# Patient Record
Sex: Male | Born: 1970 | Race: Black or African American | Hispanic: No | Marital: Married | Smoking: Current some day smoker
Health system: Southern US, Community
[De-identification: ages and names within clinical notes are randomized; demographics above are authoritative.]

## PROBLEM LIST (undated history)

## (undated) DIAGNOSIS — I10 Essential (primary) hypertension: Secondary | ICD-10-CM

## (undated) DIAGNOSIS — G4730 Sleep apnea, unspecified: Secondary | ICD-10-CM

## (undated) HISTORY — PX: OTHER SURGICAL HISTORY: SHX169

---

## 2005-09-03 ENCOUNTER — Emergency Department (HOSPITAL_COMMUNITY): Admission: EM | Admit: 2005-09-03 | Discharge: 2005-09-03 | Payer: Self-pay | Admitting: Emergency Medicine

## 2020-08-25 ENCOUNTER — Other Ambulatory Visit: Payer: Self-pay | Admitting: Surgery

## 2020-08-25 DIAGNOSIS — C189 Malignant neoplasm of colon, unspecified: Secondary | ICD-10-CM

## 2020-09-04 ENCOUNTER — Other Ambulatory Visit: Payer: Self-pay

## 2020-09-04 ENCOUNTER — Ambulatory Visit
Admission: RE | Admit: 2020-09-04 | Discharge: 2020-09-04 | Disposition: A | Discharge status: Home / Self Care | Payer: Federal, State, Local not specified - PPO | Source: Ambulatory Visit | Attending: Surgery | Admitting: Surgery

## 2020-09-04 DIAGNOSIS — C189 Malignant neoplasm of colon, unspecified: Secondary | ICD-10-CM

## 2020-09-04 MED ORDER — IOPAMIDOL (ISOVUE-300) INJECTION 61%
75.0000 mL | Freq: Once | INTRAVENOUS | Status: AC | PRN
  Administered 2020-09-04: 75 mL via INTRAVENOUS

## 2020-09-14 NOTE — Progress Notes (Addendum)
DUE TO COVID-19 ONLY ONE VISITOR IS ALLOWED TO COME WITH YOU AND STAY IN THE WAITING ROOM ONLY DURING PRE OP AND PROCEDURE DAY OF SURGERY. THE 1 VISITOR  MAY VISIT WITH YOU AFTER SURGERY IN YOUR PRIVATE ROOM DURING VISITING HOURS ONLY!  YOU NEED TO HAVE A COVID 19 TEST ON__9/28/21 _____ @_1045am______ , THIS TEST MUST BE DONE BEFORE SURGERY,  COVID TESTING SITE [address] [place] [address], IT IS ON THE RIGHT GOING OUT [address] APPROXIMATELY  2 MINUTES PAST [employer] ON THE RIGHT. ONCE YOUR COVID TEST IS COMPLETED,  PLEASE BEGIN THE QUARANTINE INSTRUCTIONS AS OUTLINED IN YOUR HANDOUT.                Christian Perez  09/14/2020   Your procedure is scheduled on:  09/18/20    Report to [facility] Main  Entrance   Report to admitting at    1130 AM     Call this number if you have problems the morning of surgery [phone]           REMEMBER: FOLLOW ALL YOUR BOWEL PREP INSTRUCTIONS WITH CLEAR LIQUIDS FROM [place].   PROCEDURE 3   NOTHING BY MOUTH EXCEPT CLEAR LIQUIDS UNTIL THREE HOURS PRIOR TO SCHEDULED SURGERY.       CLEAR LIQUID DIET   Foods Allowed                                                                      Coffee and tea, regular and decaf                           Plain Jell-O any favor except red or purple                                         Fruit ices (not with fruit pulp)                                      Iced Popsicles                                     Carbonated beverages, regular and diet                                    Cranberry, grape and apple juices Sports drinks like Gatorade Lightly seasoned clear broth or consume(fat free) Sugar, honey syrup  _____________________________________________________________________      BRUSH YOUR TEETH MORNING OF SURGERY AND RINSE YOUR MOUTH OUT, NO CHEWING GUM CANDY OR MINTS.     Take these medicines the morning of surgery with A SIP OF WATER:  None  DO NOT TAKE ANY DIABETIC MEDICATIONS DAY OF YOUR SURGERY                               You may not have any metal on your body including hair pins and              piercings  Do not wear jewelry, make-up, lotions, powders or perfumes, deodorant             Do not wear nail polish on your fingernails.  Do not shave  48 hours prior to surgery.              Men may shave face and neck.   Do not bring valuables to the hospital. [place].  Contacts, dentures or bridgework may not be worn into surgery.  Leave suitcase in the car. After surgery it may be brought to your room.                 Please read over the following fact sheets you were given: _____________________________________________________________________  [facility] - Preparing for Surgery Before surgery, you can play an important role.  Because skin is not sterile, your skin needs to be as free of germs as possible.  You can reduce the number of germs on your skin by washing with CHG (chlorahexidine gluconate) soap before surgery.  CHG is an antiseptic cleaner which kills germs and bonds with the skin to continue killing germs even after washing. Please DO NOT use if you have an allergy to CHG or antibacterial soaps.  If your skin becomes reddened/irritated stop using the CHG and inform your nurse when you arrive at Short Stay. Do not shave (including legs and underarms) for at least 48 hours prior to the first CHG shower.  You may shave your face/neck. Please follow these instructions carefully:  1.  Shower with CHG Soap the night before surgery and the  morning of Surgery.  2.  If you choose to wash your hair, wash your hair first as usual with your  normal  shampoo.  3.  After you shampoo, rinse your hair and body thoroughly to remove the  shampoo.                           4.  Use CHG as you would any other liquid soap.  You can apply chg  directly  to the skin and wash                       Gently with a scrungie or clean washcloth.  5.  Apply the CHG Soap to your body ONLY FROM THE NECK DOWN.   Do not use on face/ open                           Wound or open sores. Avoid contact with eyes, ears mouth and genitals (private parts).                       Wash face,  Genitals (private parts) with your normal soap.             6.  Wash thoroughly, paying special attention to the area  where your surgery  will be performed.  7.  Thoroughly rinse your body with warm water from the neck down.  8.  DO NOT shower/wash with your normal soap after using and rinsing off  the CHG Soap.                9.  Pat yourself dry with a clean towel.            10.  Wear clean pajamas.            11.  Place clean sheets on your bed the night of your first shower and do not  sleep with pets. Day of Surgery : Do not apply any lotions/deodorants the morning of surgery.  Please wear clean clothes to the hospital/surgery center.  FAILURE TO FOLLOW THESE INSTRUCTIONS MAY RESULT IN THE CANCELLATION OF YOUR SURGERY PATIENT SIGNATURE_________________________________  NURSE SIGNATURE__________________________________  ________________________________________________________________________

## 2020-09-15 ENCOUNTER — Encounter (HOSPITAL_COMMUNITY): Payer: Self-pay

## 2020-09-15 ENCOUNTER — Encounter (HOSPITAL_COMMUNITY)
Admission: RE | Admit: 2020-09-15 | Discharge: 2020-09-15 | Disposition: A | Discharge status: Home / Self Care | Payer: Federal, State, Local not specified - PPO | Source: Ambulatory Visit | Attending: Surgery | Admitting: Surgery

## 2020-09-15 ENCOUNTER — Other Ambulatory Visit: Payer: Self-pay

## 2020-09-15 ENCOUNTER — Other Ambulatory Visit (HOSPITAL_COMMUNITY)
Admission: RE | Admit: 2020-09-15 | Discharge: 2020-09-15 | Disposition: A | Discharge status: Home / Self Care | Payer: Federal, State, Local not specified - PPO | Source: Ambulatory Visit | Attending: Surgery | Admitting: Surgery

## 2020-09-15 ENCOUNTER — Other Ambulatory Visit (HOSPITAL_COMMUNITY): Payer: Federal, State, Local not specified - PPO

## 2020-09-15 DIAGNOSIS — Z01812 Encounter for preprocedural laboratory examination: Secondary | ICD-10-CM | POA: Insufficient documentation

## 2020-09-15 DIAGNOSIS — U071 COVID-19: Secondary | ICD-10-CM | POA: Diagnosis not present

## 2020-09-15 HISTORY — DX: COVID-19: U07.1

## 2020-09-15 HISTORY — DX: Sleep apnea, unspecified: G47.30

## 2020-09-15 HISTORY — DX: Essential (primary) hypertension: I10

## 2020-09-15 LAB — BASIC METABOLIC PANEL
Anion gap: 11 (ref 5–15)
BUN: 13 mg/dL (ref 6–20)
CO2: 27 mmol/L (ref 22–32)
Calcium: 8.9 mg/dL (ref 8.9–10.3)
Chloride: 103 mmol/L (ref 98–111)
Creatinine, Ser: 1.37 mg/dL — ABNORMAL HIGH (ref 0.61–1.24)
GFR calc Af Amer: >60 mL/min (ref >60)
GFR calc non Af Amer: 59 mL/min — ABNORMAL LOW (ref >60)
Glucose, Bld: 103 mg/dL — ABNORMAL HIGH (ref 70–99)
Potassium: 3.7 mmol/L (ref 3.5–5.1)
Sodium: 141 mmol/L (ref 135–145)

## 2020-09-15 LAB — CBC
HCT: 45.6 % (ref 39.0–52.0)
Hemoglobin: 14.8 g/dL (ref 13.0–17.0)
MCH: 27.8 pg (ref 26.0–34.0)
MCHC: 32.5 g/dL (ref 30.0–36.0)
MCV: 85.6 fL (ref 80.0–100.0)
Platelets: 160 10*3/uL (ref 150–400)
RBC: 5.33 MIL/uL (ref 4.22–5.81)
RDW: 13.5 % (ref 11.5–15.5)
WBC: 4.3 10*3/uL (ref 4.0–10.5)
nRBC: 0 % (ref 0.0–0.2)

## 2020-09-15 LAB — SARS CORONAVIRUS 2 (TAT 6-24 HRS): SARS Coronavirus 2: POSITIVE — AB

## 2020-09-15 NOTE — Progress Notes (Signed)
Anesthesia Review:  PCP: DR Durene Fruits at [facility] on [address]  Cardiologist : Chest x-ray : EKG : requested EKG from [facility] on [address] They are to fax  Echo : Stress test: Cardiac Cath :  Activity level: yes  Sleep Study/ CPAP :yes  Fasting Blood Sugar :      / Checks Blood Sugar -- times a day:   Blood Thinner/ Instructions /Last Dose: ASA / Instructions/ Last Dose :  BMP done 09/15/20 faxed via epic to Dr Dema Severin.

## 2020-09-15 NOTE — Progress Notes (Signed)
Requested ekg from [place] at [facility] .

## 2020-09-16 ENCOUNTER — Telehealth: Payer: Self-pay | Admitting: Infectious Diseases

## 2020-09-16 ENCOUNTER — Ambulatory Visit: Payer: Self-pay | Admitting: Surgery

## 2020-09-16 NOTE — H&P (Signed)
CC: Referred by [facility] for newly diagnosed cecal neuroendocrine tumor  HPI: Mr. Christian Perez is a very pleasant 30yoM with hx of HTN, HLD who underwent his first screening colonoscopy at [facility] - 07/02/20 - was found to have a 3 cm mass in the cecum. This was biopsied and returned as a neuroendocrine tumor (carcinoid). He ultimately underwent a CT abdomen and pelvis for staging purposes at the end of August. This demonstrated a 2.5 cm mass in the cecum abutting the soft tissue medially. There is no evidence of metastatic disease noted. All we have is a copy of the report. We do not have physical images. Did not have a CT of the chest performed yet. He denies any symptoms related to this. He denies any history of rectal bleeding or abdominal pain. He states his colonoscopy was for screening purposes only.   PMH: HTN, HLD  PSH: Right wrist surgery; denies any abdominal/pelvic procedures  FHx: Only known family hx of malignancy is maternal grandmother may have had breast cancer  Social: Denies use of drugs; smokes 1 cigar per week; drinks EtOH once per week. Works in the [employer] as a Designer, industrial/product. Has 2 children - 49 yo son and 56 yo daughter  ROS: A comprehensive 10 system review of systems was completed with the patient and pertinent findings as noted above.  The patient is a 49 year old male.   Past Surgical History Mammie Lorenzo, LPN; 08/24/1883 1:66 AM) Colon Polyp Removal - Colonoscopy   Diagnostic Studies History Mammie Lorenzo, LPN; 0/05/3015 0:10 AM) Colonoscopy  within last year  Allergies Mammie Lorenzo, LPN; 08/21/2354 7:32 AM) No Known Drug Allergies  [08/25/2020]: Allergies Reconciled   Medication History Mammie Lorenzo, LPN; 2/0/2542 7:06 AM) amLODIPine-Olmesartan (10-40MG  Tablet, Oral) Active. Bystolic (5MG  Tablet, Oral) Active. hydroCHLOROthiazide (25MG  Tablet, Oral) Active. Pravastatin Sodium (40MG  Tablet,  Oral) Active. Vitamin D (Ergocalciferol) (1.25 MG(50000 UT) Capsule, Oral) Active. Medications Reconciled  Social History Mammie Lorenzo, LPN; 01/21/7627 3:15 AM) Alcohol use  Moderate alcohol use. Caffeine use  Carbonated beverages, Tea. No drug use  Tobacco use  Current some day smoker.  Family History Mammie Lorenzo, LPN; 12/25/6158 7:37 AM) Alcohol Abuse  Father. Cerebrovascular Accident  Mother. Depression  Brother. Diabetes Mellitus  Mother. Hypertension  Mother. Seizure disorder  Brother.  Other Problems Mammie Lorenzo, LPN; 1/0/6269 4:85 AM) Back Pain  High blood pressure  Sleep Apnea     Review of Systems [place] Gave M. Marquisa Salih MD; 08/25/2020 9:32 AM) General Not Present- Appetite Loss, Chills, Fatigue, Fever, Night Sweats, Weight Gain and Weight Loss. Skin Not Present- Change in Wart/Mole, Dryness, Hives, Jaundice, New Lesions, Non-Healing Wounds, Rash and Ulcer. HEENT Present- Wears glasses/contact lenses. Not Present- Earache, Hearing Loss, Hoarseness, Nose Bleed, Oral Ulcers, Ringing in the Ears, Seasonal Allergies, Sinus Pain, Sore Throat, Visual Disturbances and Yellow Eyes. Respiratory Not Present- Bloody sputum, Chronic Cough, Difficulty Breathing, Snoring and Wheezing. Breast Not Present- Breast Mass, Breast Pain, Nipple Discharge and Skin Changes. Cardiovascular Not Present- Chest Pain, Difficulty Breathing Lying Down, Leg Cramps, Palpitations, Rapid Heart Rate, Shortness of Breath and Swelling of Extremities. Gastrointestinal Not Present- Abdominal Pain, Bloating, Bloody Stool, Change in Bowel Habits, Chronic diarrhea, Constipation, Difficulty Swallowing, Excessive gas, Gets full quickly at meals, Hemorrhoids, Indigestion, Nausea, Rectal Pain and Vomiting. Male Genitourinary Not Present- Blood in Urine, Change in Urinary Stream, Frequency, Impotence, Nocturia, Painful Urination, Urgency and Urine Leakage. Neurological Not Present- Decreased Memory and  Difficulty Speaking. Psychiatric Not  Present- Anxiety and Depression. Hematology Not Present- Abnormal Bleeding, Blood Clots and Blood Thinners.  Vitals Claiborne Billings Dockery LPN; 01/23/9562 8:75 AM) 08/25/2020 9:02 AM Weight: 294.6 lb Height: 79in Body Surface Area: 2.69 m Body Mass Index: 33.19 kg/m  Temp.: 98.18F (Thermal Scan)  Pulse: 68 (Regular)  BP: 142/82(Sitting, Left Arm, Standard)       Physical Exam Harrell Gave M. Rilan Eiland MD; 08/25/2020 9:33 AM) The physical exam findings are as follows: Note: Constitutional: No acute distress; conversant; no deformities; wearing mask Eyes: Moist conjunctiva; no lid lag; anicteric sclerae; pupils equal and round Neck: Trachea midline; no palpable thyromegaly Lungs: Normal respiratory effort; no tactile fremitus CV: rrr; no palpable thrill; no pitting edema GI: Abdomen soft, nontender, nondistended; no palpable hepatosplenomegaly. No palpable masses MSK: Normal gait; no clubbing/cyanosis Psychiatric: Appropriate affect; alert and oriented 3 Lymphatic: No palpable cervical or axillary lymphadenopathy    Assessment & Plan [place]rell Gave M. Quintana Canelo MD; 08/25/2020 9:36 AM) COLON CANCER (C18.9)  Mr. Christian Perez is a very pleasant 32yoM with newly diagnosed cecal neuroendocrine tumor (carcinoid) - found on screening colonoscopy back 07/02/20.  CT AP through [place] center showed no evidence of metastasis intra-abdominally; we do not have CT chest yet though  -The anatomy and physiology of the GI tract was discussed at length with the patient. The pathophysiology of colon masses and NETs was discussed at length with associated pictures. -CT Chest showed no evidence of metastatic disease. We were able to review CT A/P - images sent on CD to our office.  -We discussed if no evidence of metastatic disease is confirmed on his workup, surgery to definitively address this lesion-laparoscopic and potential open techniques to a right  hemicolectomy. -The planned procedure, material risks (including, but not limited to, pain, bleeding, infection, scarring, need for blood transfusion, damage to surrounding structures- blood vessels/nerves/viscus/organs, damage to ureter, urine leak, leak from anastomosis, need for additional procedures, worsening of pre-existing medical conditions, need for stoma which may be permanent, hernia, recurrence, DVT/PE, pneumonia, heart attack, stroke, death) benefits and alternatives to surgery were discussed at length. The patient's questions were answered to his satisfaction, he voiced understanding and elected to proceed with surgery. Additionally, we discussed typical postoperative expectations and the recovery process - including time away from work expectations and recovery therein.  This patient encounter took 40 minutes today to perform the following: take history, perform exam, review outside records, interpret imaging, counsel the patient on their diagnosis and document encounter, findings & plan in the EHR  Signed by Ileana Roup, MD (08/25/2020 9:37 AM)

## 2020-09-16 NOTE — Telephone Encounter (Signed)
Called to Discuss with patient about Covid symptoms and the use of the monoclonal antibody infusion for those with mild to moderate Covid symptoms and at a high risk of hospitalization.     Pt appears to qualify for this infusion due to co-morbid conditions and/or a member of an at-risk group in accordance with the FDA Emergency Use Authorization.    Not feeling so great. Better but not great. Feels like symptoms started on Saturday afternoon. Now mostly symptoms he is experiencing is anosmia, altered taste, upper sinus symptoms.   Will schedule tomorrow for infusion with risk criteria HTN, BMI > 25.    Janene Madeira, MSN, NP-C [facility] [email or web address] Pager: [phone] Office: [phone] [place]: [phone]

## 2020-09-17 ENCOUNTER — Ambulatory Visit (HOSPITAL_COMMUNITY)
Admission: RE | Admit: 2020-09-17 | Discharge: 2020-09-17 | Disposition: A | Discharge status: Home / Self Care | Payer: Federal, State, Local not specified - PPO | Source: Ambulatory Visit | Attending: Pulmonary Disease | Admitting: Pulmonary Disease

## 2020-09-17 VITALS — BP 142/97 | HR 54 | Temp 98.6°F | Resp 16

## 2020-09-17 DIAGNOSIS — U071 COVID-19: Secondary | ICD-10-CM | POA: Diagnosis not present

## 2020-09-17 MED ORDER — ALBUTEROL SULFATE HFA 108 (90 BASE) MCG/ACT IN AERS: 2.0000 | INHALATION_SPRAY | Freq: Once | RESPIRATORY_TRACT | Status: DC | PRN

## 2020-09-17 MED ORDER — SODIUM CHLORIDE 0.9 % IV SOLN
1200.0000 mg | Freq: Once | INTRAVENOUS | Status: AC
  Administered 2020-09-17: 1200 mg via INTRAVENOUS

## 2020-09-17 MED ORDER — FAMOTIDINE IN NACL 20-0.9 MG/50ML-% IV SOLN: 20.0000 mg | Freq: Once | INTRAVENOUS | Status: DC | PRN

## 2020-09-17 MED ORDER — METHYLPREDNISOLONE SODIUM SUCC 125 MG IJ SOLR: 125.0000 mg | Freq: Once | INTRAMUSCULAR | Status: DC | PRN

## 2020-09-17 MED ORDER — EPINEPHRINE 0.3 MG/0.3ML IJ SOAJ: 0.3000 mg | Freq: Once | INTRAMUSCULAR | Status: DC | PRN

## 2020-09-17 MED ORDER — DIPHENHYDRAMINE HCL 50 MG/ML IJ SOLN: 50.0000 mg | Freq: Once | INTRAMUSCULAR | Status: DC | PRN

## 2020-09-17 MED ORDER — SODIUM CHLORIDE 0.9 % IV SOLN: INTRAVENOUS | Status: DC | PRN

## 2020-09-17 NOTE — Progress Notes (Signed)
  Diagnosis: COVID-19  Physician: Dr. Joya Gaskins  Procedure: Covid Infusion Clinic Med: casirivimab\imdevimab infusion - Provided patient with casirivimab\imdevimab fact sheet for patients, parents and caregivers prior to infusion.  Complications: No immediate complications noted.  Discharge: Discharged home   Rejeana Brock 09/17/2020

## 2020-09-17 NOTE — Discharge Instructions (Signed)

## 2020-09-17 NOTE — Progress Notes (Signed)
MD aware of positive COVID result

## 2020-09-18 LAB — TYPE AND SCREEN
ABO/RH(D): O POS
Antibody Screen: NEGATIVE

## 2020-10-15 ENCOUNTER — Encounter (HOSPITAL_COMMUNITY): Payer: Self-pay

## 2020-10-15 NOTE — Patient Instructions (Addendum)
DUE TO COVID-19 ONLY ONE VISITOR IS ALLOWED TO COME WITH YOU AND STAY IN THE WAITING ROOM ONLY DURING PRE OP AND PROCEDURE.   IF YOU WILL BE ADMITTED INTO THE HOSPITAL YOU ARE ALLOWED ONE SUPPORT PERSON DURING VISITATION HOURS ONLY (10AM -8PM)   . The support person may change daily. . The support person must pass our screening, gel in and out, and wear a mask at all times, including in the patient's room. . Patients must also wear a mask when staff or their support person are in the room.   Your procedure is scheduled on: Friday, 10-23-20   Report to [facility] Main  Entrance   Report to admitting at 9:00 AM AM   Call this number if you have problems the morning of surgery [phone]    Follow a clear liquid diet day of prep to prevent dehydration    Bowel Prep   Polyethylene Glycol Powder (Glycolax/Miralax) - Mix with 64 oz Gatorade/Powerade.  Drink gradually over the next few hours (8 oz glass every  15-30 minutes) until gone the day prior to surgery   Bisacodyl (Dulcolax) EC tablet 20 mg - Take with water the day prior to surgery   Neomycin (Mycifradin) tablet 1000 mg - Take at 2 pm, 3 pm and 10 pm after Miralax bowel prep the day prior to surgery.   Metronidazole (Flagyl) tablet 1000 mg - Take at 2 pm, 3 pm and 10 pm  after Miralax bowel prep the day prior to surgery.    Do not eat food :After Midnight.   May have liquids until 8:00 AM  day of surgery  CLEAR LIQUID DIET  Foods Allowed                                                                     Foods Excluded  Water, Black Coffee and tea, regular and decaf               liquids that you cannot  Plain Jell-O in any flavor  (No red)                                      see through such as: Fruit ices (not with fruit pulp)                                      milk, soups, orange juice              Iced Popsicles (No red)                                      All solid food                                    Apple juices Sports drinks like Gatorade (No red) Lightly seasoned clear broth or consume(fat free) Sugar, honey syrup  Sample Menu Breakfast  Lunch                                     Supper Cranberry juice                    Beef broth                            Chicken broth Jell-O                                     Grape juice                           Apple juice Coffee or tea                        Jell-O                                      Popsicle                                                Coffee or tea                        Coffee or tea       Drink 2 Ensure drinks the night before surgery.      Complete one Ensure drink the morning of surgery 3 hours prior to scheduled surgery at 8:00 AM.   Oral Hygiene is also important to reduce your risk of infection.                                    Remember - BRUSH YOUR TEETH THE MORNING OF SURGERY WITH YOUR REGULAR TOOTHPASTE   Do NOT smoke after Midnight   Take these medicines the morning of surgery with A SIP OF WATER:  Bystolic, Pravastatin                                You may not have any metal on your body including  jewelry, and body piercings             Do not wear  lotions, powders, perfumes/cologne, or deodorant             Men may shave face and neck.   Do not bring valuables to the hospital. [place].   Contacts, dentures or bridgework may not be worn into surgery.   Bring small overnight bag day of surgery.                 Please read over the following fact sheets you were given: IF YOU HAVE QUESTIONS ABOUT YOUR PRE OP INSTRUCTIONS PLEASE CALL [phone]   [facility] - Preparing for Surgery Before surgery, you can play an important role.  Because skin  is not sterile, your skin needs to be as free of germs as possible.  You can reduce the number of germs on your skin by washing with CHG (chlorahexidine gluconate) soap before  surgery.  CHG is an antiseptic cleaner which kills germs and bonds with the skin to continue killing germs even after washing. Please DO NOT use if you have an allergy to CHG or antibacterial soaps.  If your skin becomes reddened/irritated stop using the CHG and inform your nurse when you arrive at Short Stay. Do not shave (including legs and underarms) for at least 48 hours prior to the first CHG shower.  You may shave your face/neck.  Please follow these instructions carefully:  1.  Shower with CHG Soap the night before surgery and the  morning of surgery.  2.  If you choose to wash your hair, wash your hair first as usual with your normal  shampoo.  3.  After you shampoo, rinse your hair and body thoroughly to remove the shampoo.                             4.  Use CHG as you would any other liquid soap.  You can apply chg directly to the skin and wash.  Gently with a scrungie or clean washcloth.  5.  Apply the CHG Soap to your body ONLY FROM THE NECK DOWN.   Do   not use on face/ open                           Wound or open sores. Avoid contact with eyes, ears mouth and   genitals (private parts).                       Wash face,  Genitals (private parts) with your normal soap.             6.  Wash thoroughly, paying special attention to the area where your    surgery  will be performed.  7.  Thoroughly rinse your body with warm water from the neck down.  8.  DO NOT shower/wash with your normal soap after using and rinsing off the CHG Soap.                9.  Pat yourself dry with a clean towel.            10.  Wear clean pajamas.            11.  Place clean sheets on your bed the night of your first shower and do not  sleep with pets. Day of Surgery : Do not apply any lotions/deodorants the morning of surgery.  Please wear clean clothes to the hospital/surgery center.  FAILURE TO FOLLOW THESE INSTRUCTIONS MAY RESULT IN THE CANCELLATION OF YOUR SURGERY  PATIENT  SIGNATURE_________________________________  NURSE SIGNATURE__________________________________  ________________________________________________________________________   Christian Perez  An incentive spirometer is a tool that can help keep your lungs clear and active. This tool measures how well you are filling your lungs with each breath. Taking long deep breaths may help reverse or decrease the chance of developing breathing (pulmonary) problems (especially infection) following:  A long period of time when you are unable to move or be active. BEFORE THE PROCEDURE   If the spirometer includes an indicator to show your best effort, your nurse or respiratory therapist  will set it to a desired goal.  If possible, sit up straight or lean slightly forward. Try not to slouch.  Hold the incentive spirometer in an upright position. INSTRUCTIONS FOR USE  1. Sit on the edge of your bed if possible, or sit up as far as you can in bed or on a chair. 2. Hold the incentive spirometer in an upright position. 3. Breathe out normally. 4. Place the mouthpiece in your mouth and seal your lips tightly around it. 5. Breathe in slowly and as deeply as possible, raising the piston or the ball toward the top of the column. 6. Hold your breath for 3-5 seconds or for as long as possible. Allow the piston or ball to fall to the bottom of the column. 7. Remove the mouthpiece from your mouth and breathe out normally. 8. Rest for a few seconds and repeat Steps 1 through 7 at least 10 times every 1-2 hours when you are awake. Take your time and take a few normal breaths between deep breaths. 9. The spirometer may include an indicator to show your best effort. Use the indicator as a goal to work toward during each repetition. 10. After each set of 10 deep breaths, practice coughing to be sure your lungs are clear. If you have an incision (the cut made at the time of surgery), support your incision when coughing  by placing a pillow or rolled up towels firmly against it. Once you are able to get out of bed, walk around indoors and cough well. You may stop using the incentive spirometer when instructed by your caregiver.  RISKS AND COMPLICATIONS  Take your time so you do not get dizzy or light-headed.  If you are in pain, you may need to take or ask for pain medication before doing incentive spirometry. It is harder to take a deep breath if you are having pain. AFTER USE  Rest and breathe slowly and easily.  It can be helpful to keep track of a log of your progress. Your caregiver can provide you with a simple table to help with this. If you are using the spirometer at home, follow these instructions: [place] IF:   You are having difficultly using the spirometer.  You have trouble using the spirometer as often as instructed.  Your pain medication is not giving enough relief while using the spirometer.  You develop fever of 100.5 F (38.1 C) or higher. SEEK IMMEDIATE MEDICAL CARE IF:   You cough up bloody sputum that had not been present before.  You develop fever of 102 F (38.9 C) or greater.  You develop worsening pain at or near the incision site. MAKE SURE YOU:   Understand these instructions.  Will watch your condition.  Will get help right away if you are not doing well or get worse. Document Released: 04/17/2007 Document Revised: 02/27/2012 Document Reviewed: 06/18/2007 Exit[employer] Patient Information 2014 [employer], [place].   ________________________________________________________________________  WHAT IS A BLOOD TRANSFUSION? Blood Transfusion Information  A transfusion is the replacement of blood or some of its parts. Blood is made up of multiple cells which provide different functions.  Red blood cells carry oxygen and are used for blood loss replacement.  White blood cells fight against infection.  Platelets control bleeding.  Plasma helps clot  blood.  Other blood products are available for specialized needs, such as hemophilia or other clotting disorders. BEFORE THE TRANSFUSION  Who gives blood for transfusions?   Healthy volunteers who are  fully evaluated to make sure their blood is safe. This is blood bank blood. Transfusion therapy is the safest it has ever been in the practice of medicine. Before blood is taken from a donor, a complete history is taken to make sure that person has no history of diseases nor engages in risky social behavior (examples are intravenous drug use or sexual activity with multiple partners). The donor's travel history is screened to minimize risk of transmitting infections, such as malaria. The donated blood is tested for signs of infectious diseases, such as HIV and hepatitis. The blood is then tested to be sure it is compatible with you in order to minimize the chance of a transfusion reaction. If you or a relative donates blood, this is often done in anticipation of surgery and is not appropriate for emergency situations. It takes many days to process the donated blood. RISKS AND COMPLICATIONS Although transfusion therapy is very safe and saves many lives, the main dangers of transfusion include:   Getting an infectious disease.  Developing a transfusion reaction. This is an allergic reaction to something in the blood you were given. Every precaution is taken to prevent this. The decision to have a blood transfusion has been considered carefully by your caregiver before blood is given. Blood is not given unless the benefits outweigh the risks. AFTER THE TRANSFUSION  Right after receiving a blood transfusion, you will usually feel much better and more energetic. This is especially true if your red blood cells have gotten low (anemic). The transfusion raises the level of the red blood cells which carry oxygen, and this usually causes an energy increase.  The nurse administering the transfusion will monitor  you carefully for complications. HOME CARE INSTRUCTIONS  No special instructions are needed after a transfusion. You may find your energy is better. Speak with your caregiver about any limitations on activity for underlying diseases you may have. SEEK MEDICAL CARE IF:   Your condition is not improving after your transfusion.  You develop redness or irritation at the intravenous (IV) site. SEEK IMMEDIATE MEDICAL CARE IF:  Any of the following symptoms occur over the next 12 hours:  Shaking chills.  You have a temperature by mouth above 102 F (38.9 C), not controlled by medicine.  Chest, back, or muscle pain.  People around you feel you are not acting correctly or are confused.  Shortness of breath or difficulty breathing.  Dizziness and fainting.  You get a rash or develop hives.  You have a decrease in urine output.  Your urine turns a dark color or changes to pink, red, or brown. Any of the following symptoms occur over the next 10 days:  You have a temperature by mouth above 102 F (38.9 C), not controlled by medicine.  Shortness of breath.  Weakness after normal activity.  The white part of the eye turns yellow (jaundice).  You have a decrease in the amount of urine or are urinating less often.  Your urine turns a dark color or changes to pink, red, or brown. Document Released: 12/02/2000 Document Revised: 02/27/2012 Document Reviewed: 07/21/2008 [facility] Patient Information 2014 [place], [place].  _______________________________________________________________________

## 2020-10-15 NOTE — Progress Notes (Addendum)
COVID Vaccine Completed:  x2 Date COVID Vaccine completed:  12-25-19 2nd shot COVID vaccine manufacturer: [employer]   +Covid 09-15-20  PCP - Dr. Cathlean [place] Cardiologist -   Chest x-ray - CT chest 09-05-20 in Epic EKG - 10-16-20 in Epic Stress Test -  ECHO -  Cardiac Cath -  Pacemaker/ICD device last checked:  Sleep Study - 2011 + sleep apnea CPAP - Yes  Fasting Blood Sugar -  Checks Blood Sugar _____ times a day  Blood Thinner Instructions: Aspirin Instructions: Last Dose:  Anesthesia review:   Patient denies shortness of breath, fever, cough and chest pain at PAT appointment   Patient verbalized understanding of instructions that were given to them at the PAT appointment. Patient was also instructed that they will need to review over the PAT instructions again at home before surgery.

## 2020-10-16 ENCOUNTER — Other Ambulatory Visit: Payer: Self-pay

## 2020-10-16 ENCOUNTER — Encounter (HOSPITAL_COMMUNITY): Payer: Self-pay

## 2020-10-16 ENCOUNTER — Encounter (HOSPITAL_COMMUNITY)
Admission: RE | Admit: 2020-10-16 | Discharge: 2020-10-16 | Disposition: A | Discharge status: Home / Self Care | Payer: Federal, State, Local not specified - PPO | Source: Ambulatory Visit | Attending: Surgery | Admitting: Surgery

## 2020-10-16 DIAGNOSIS — I10 Essential (primary) hypertension: Secondary | ICD-10-CM | POA: Diagnosis not present

## 2020-10-16 DIAGNOSIS — Z01818 Encounter for other preprocedural examination: Secondary | ICD-10-CM | POA: Insufficient documentation

## 2020-10-16 LAB — CBC WITH DIFFERENTIAL/PLATELET
Abs Immature Granulocytes: 0 10*3/uL (ref 0.00–0.07)
Basophils Absolute: 0 10*3/uL (ref 0.0–0.1)
Basophils Relative: 1 %
Eosinophils Absolute: 0.1 10*3/uL (ref 0.0–0.5)
Eosinophils Relative: 2 %
HCT: 44.9 % (ref 39.0–52.0)
Hemoglobin: 14.7 g/dL (ref 13.0–17.0)
Immature Granulocytes: 0 %
Lymphocytes Relative: 35 %
Lymphs Abs: 1.3 10*3/uL (ref 0.7–4.0)
MCH: 27.5 pg (ref 26.0–34.0)
MCHC: 32.7 g/dL (ref 30.0–36.0)
MCV: 84.1 fL (ref 80.0–100.0)
Monocytes Absolute: 0.5 10*3/uL (ref 0.1–1.0)
Monocytes Relative: 13 %
Neutro Abs: 1.8 10*3/uL (ref 1.7–7.7)
Neutrophils Relative %: 49 %
Platelets: 169 10*3/uL (ref 150–400)
RBC: 5.34 MIL/uL (ref 4.22–5.81)
RDW: 13.3 % (ref 11.5–15.5)
WBC: 3.7 10*3/uL — ABNORMAL LOW (ref 4.0–10.5)
nRBC: 0 % (ref 0.0–0.2)

## 2020-10-16 LAB — COMPREHENSIVE METABOLIC PANEL
ALT: 33 U/L (ref 0–44)
AST: 26 U/L (ref 15–41)
Albumin: 4.2 g/dL (ref 3.5–5.0)
Alkaline Phosphatase: 52 U/L (ref 38–126)
Anion gap: 11 (ref 5–15)
BUN: 14 mg/dL (ref 6–20)
CO2: 26 mmol/L (ref 22–32)
Calcium: 9 mg/dL (ref 8.9–10.3)
Chloride: 102 mmol/L (ref 98–111)
Creatinine, Ser: 1.32 mg/dL — ABNORMAL HIGH (ref 0.61–1.24)
GFR, Estimated: >60 mL/min (ref >60)
Glucose, Bld: 98 mg/dL (ref 70–99)
Potassium: 3.6 mmol/L (ref 3.5–5.1)
Sodium: 139 mmol/L (ref 135–145)
Total Bilirubin: 1 mg/dL (ref 0.3–1.2)
Total Protein: 7.2 g/dL (ref 6.5–8.1)

## 2020-10-16 LAB — PROTIME-INR
INR: 1 (ref 0.8–1.2)
Prothrombin Time: 13 seconds (ref 11.4–15.2)

## 2020-10-16 LAB — APTT: aPTT: 30 seconds (ref 24–36)

## 2020-10-17 LAB — HEMOGLOBIN A1C
Hgb A1c MFr Bld: 6.3 % — ABNORMAL HIGH (ref 4.8–5.6)
Mean Plasma Glucose: 134 mg/dL

## 2020-10-20 ENCOUNTER — Inpatient Hospital Stay
Admission: RE | Admit: 2020-10-20 | Discharge: 2020-10-20 | Disposition: A | Discharge status: Home / Self Care | Payer: Self-pay | Source: Ambulatory Visit | Attending: Surgery | Admitting: Surgery

## 2020-10-20 ENCOUNTER — Other Ambulatory Visit (HOSPITAL_COMMUNITY): Payer: Self-pay | Admitting: Surgery

## 2020-10-20 DIAGNOSIS — C801 Malignant (primary) neoplasm, unspecified: Secondary | ICD-10-CM

## 2020-10-22 MED ORDER — BUPIVACAINE LIPOSOME 1.3 % IJ SUSP
20.0000 mL | Freq: Once | INTRAMUSCULAR | Status: DC
  Filled 2020-10-22: qty 20

## 2020-10-22 NOTE — Progress Notes (Signed)
Bowel prep instructions states to mix Miralex with 64 oz of Gatorade. Pt stated that he bought packages of Miralex, and wondering if the quantities of the packages would be sufficient. Pt also concerned that the instructions stated that he should drink 2 Ensure the night before, and 1 Ensure the morning of. However, he  wasn't given the ensure, because  his surgery date had changed. When he came in for his prior PST appt, and the nurse thought that it was given to him previously, and didn't give it to him at his 10-16-20 PST appt.. As a result, he bought some over the counter Ensure.  Patient advise that the instructions given were from Dr. Orest Dikes office. Therefore, he would need to contact Dr Orest Dikes office to verify if the quantity of the packages are okay. Also, advised that the Pre-surgery Ensure is hospital specific and cannot be purchased over the counter. Let  Dr. Jackson Latino office know that it wasn't provided to him, verses drinking the incorrect ensure.  Dr. Orest Dikes number provided to the patient. Pt agreeable and  verbalized understanding.

## 2020-10-23 ENCOUNTER — Inpatient Hospital Stay (HOSPITAL_COMMUNITY)
Admission: RE | Admit: 2020-10-23 | Discharge: 2020-10-26 | DRG: 331 | Disposition: A | Discharge status: Home / Self Care | Payer: Federal, State, Local not specified - PPO | Attending: Surgery | Admitting: Surgery

## 2020-10-23 ENCOUNTER — Encounter (HOSPITAL_COMMUNITY)
Admission: RE | Disposition: A | Discharge status: Home / Self Care | Payer: Self-pay | Source: Home / Self Care | Attending: Surgery

## 2020-10-23 ENCOUNTER — Inpatient Hospital Stay (HOSPITAL_COMMUNITY): Payer: Federal, State, Local not specified - PPO | Admitting: Certified Registered"

## 2020-10-23 ENCOUNTER — Encounter (HOSPITAL_COMMUNITY): Payer: Self-pay | Admitting: Surgery

## 2020-10-23 ENCOUNTER — Other Ambulatory Visit: Payer: Self-pay

## 2020-10-23 DIAGNOSIS — Z79899 Other long term (current) drug therapy: Secondary | ICD-10-CM

## 2020-10-23 DIAGNOSIS — I10 Essential (primary) hypertension: Secondary | ICD-10-CM | POA: Diagnosis present

## 2020-10-23 DIAGNOSIS — E785 Hyperlipidemia, unspecified: Secondary | ICD-10-CM | POA: Diagnosis present

## 2020-10-23 DIAGNOSIS — F17290 Nicotine dependence, other tobacco product, uncomplicated: Secondary | ICD-10-CM | POA: Diagnosis present

## 2020-10-23 DIAGNOSIS — Z23 Encounter for immunization: Secondary | ICD-10-CM | POA: Diagnosis not present

## 2020-10-23 DIAGNOSIS — Z8616 Personal history of COVID-19: Secondary | ICD-10-CM

## 2020-10-23 DIAGNOSIS — C180 Malignant neoplasm of cecum: Secondary | ICD-10-CM | POA: Diagnosis present

## 2020-10-23 DIAGNOSIS — C189 Malignant neoplasm of colon, unspecified: Secondary | ICD-10-CM | POA: Diagnosis present

## 2020-10-23 HISTORY — PX: LAPAROSCOPIC RIGHT HEMI COLECTOMY: SHX5926

## 2020-10-23 LAB — TYPE AND SCREEN
ABO/RH(D): O POS
Antibody Screen: NEGATIVE

## 2020-10-23 SURGERY — LAPAROSCOPIC RIGHT HEMI COLECTOMY
Anesthesia: General | Site: Abdomen | Laterality: Right

## 2020-10-23 MED ORDER — SODIUM CHLORIDE (PF) 0.9 % IJ SOLN
INTRAMUSCULAR | Status: AC
  Filled 2020-10-23: qty 20

## 2020-10-23 MED ORDER — BUPIVACAINE HCL 0.5 % IJ SOLN
INTRAMUSCULAR | Status: DC | PRN
  Administered 2020-10-23: 15 mL

## 2020-10-23 MED ORDER — ONDANSETRON HCL 4 MG PO TABS: 4.0000 mg | ORAL_TABLET | Freq: Four times a day (QID) | ORAL | Status: DC | PRN

## 2020-10-23 MED ORDER — ALVIMOPAN 12 MG PO CAPS
12.0000 mg | ORAL_CAPSULE | Freq: Two times a day (BID) | ORAL | Status: DC
  Filled 2020-10-23: qty 1

## 2020-10-23 MED ORDER — HYDROMORPHONE HCL 1 MG/ML IJ SOLN
0.5000 mg | INTRAMUSCULAR | Status: DC | PRN
  Administered 2020-10-23: 16:00:00 0.5 mg via INTRAVENOUS
  Filled 2020-10-23: qty 0.5

## 2020-10-23 MED ORDER — AMLODIPINE BESYLATE 10 MG PO TABS
10.0000 mg | ORAL_TABLET | Freq: Every day | ORAL | Status: DC
  Administered 2020-10-24 – 2020-10-26 (×3): 10 mg via ORAL
  Filled 2020-10-23 (×3): qty 1

## 2020-10-23 MED ORDER — SUGAMMADEX SODIUM 500 MG/5ML IV SOLN
INTRAVENOUS | Status: AC
  Filled 2020-10-23: qty 5

## 2020-10-23 MED ORDER — ALUM & MAG HYDROXIDE-SIMETH 200-200-20 MG/5ML PO SUSP: 30.0000 mL | Freq: Four times a day (QID) | ORAL | Status: DC | PRN

## 2020-10-23 MED ORDER — BISACODYL 5 MG PO TBEC: 20.0000 mg | DELAYED_RELEASE_TABLET | Freq: Once | ORAL | Status: DC

## 2020-10-23 MED ORDER — EPHEDRINE SULFATE-NACL 50-0.9 MG/10ML-% IV SOSY
PREFILLED_SYRINGE | INTRAVENOUS | Status: DC | PRN
  Administered 2020-10-23: 10 mg via INTRAVENOUS

## 2020-10-23 MED ORDER — ONDANSETRON HCL 4 MG/2ML IJ SOLN
INTRAMUSCULAR | Status: AC
  Filled 2020-10-23: qty 2

## 2020-10-23 MED ORDER — EPHEDRINE 5 MG/ML INJ
INTRAVENOUS | Status: AC
  Filled 2020-10-23: qty 10

## 2020-10-23 MED ORDER — TRAMADOL HCL 50 MG PO TABS
50.0000 mg | ORAL_TABLET | Freq: Four times a day (QID) | ORAL | 0 refills | Status: DC | PRN
Start: 2020-10-23 — End: 2020-10-23

## 2020-10-23 MED ORDER — ROCURONIUM BROMIDE 10 MG/ML (PF) SYRINGE
PREFILLED_SYRINGE | INTRAVENOUS | Status: AC
  Filled 2020-10-23: qty 10

## 2020-10-23 MED ORDER — POLYETHYLENE GLYCOL 3350 17 GM/SCOOP PO POWD: 1.0000 | Freq: Once | ORAL | Status: DC

## 2020-10-23 MED ORDER — SODIUM CHLORIDE (PF) 0.9 % IJ SOLN
INTRAMUSCULAR | Status: DC | PRN
  Administered 2020-10-23: 15 mL

## 2020-10-23 MED ORDER — FENTANYL CITRATE (PF) 100 MCG/2ML IJ SOLN
25.0000 ug | INTRAMUSCULAR | Status: DC | PRN
  Administered 2020-10-23: 50 ug via INTRAVENOUS

## 2020-10-23 MED ORDER — LIDOCAINE 20MG/ML (2%) 15 ML SYRINGE OPTIME
INTRAMUSCULAR | Status: DC | PRN
  Administered 2020-10-23: 1.5 mg/kg/h via INTRAVENOUS

## 2020-10-23 MED ORDER — HYDRALAZINE HCL 20 MG/ML IJ SOLN: 10.0000 mg | INTRAMUSCULAR | Status: DC | PRN

## 2020-10-23 MED ORDER — NEOMYCIN SULFATE 500 MG PO TABS: 1000.0000 mg | ORAL_TABLET | ORAL | Status: DC

## 2020-10-23 MED ORDER — FENTANYL CITRATE (PF) 250 MCG/5ML IJ SOLN
INTRAMUSCULAR | Status: AC
  Filled 2020-10-23: qty 5

## 2020-10-23 MED ORDER — LACTATED RINGERS IV SOLN: INTRAVENOUS | Status: DC

## 2020-10-23 MED ORDER — FENTANYL CITRATE (PF) 100 MCG/2ML IJ SOLN
INTRAMUSCULAR | Status: AC
  Administered 2020-10-23: 50 ug via INTRAVENOUS
  Filled 2020-10-23: qty 2

## 2020-10-23 MED ORDER — ROCURONIUM BROMIDE 10 MG/ML (PF) SYRINGE
PREFILLED_SYRINGE | INTRAVENOUS | Status: DC | PRN
  Administered 2020-10-23: 100 mg via INTRAVENOUS

## 2020-10-23 MED ORDER — DEXAMETHASONE SODIUM PHOSPHATE 10 MG/ML IJ SOLN
INTRAMUSCULAR | Status: AC
  Filled 2020-10-23: qty 1

## 2020-10-23 MED ORDER — ENSURE PRE-SURGERY PO LIQD
296.0000 mL | Freq: Once | ORAL | Status: DC
  Filled 2020-10-23: qty 296

## 2020-10-23 MED ORDER — AMISULPRIDE (ANTIEMETIC) 5 MG/2ML IV SOLN: 10.0000 mg | Freq: Once | INTRAVENOUS | Status: DC | PRN

## 2020-10-23 MED ORDER — HEPARIN SODIUM (PORCINE) 5000 UNIT/ML IJ SOLN: 5000.0000 [IU] | Freq: Once | INTRAMUSCULAR | Status: AC

## 2020-10-23 MED ORDER — ENSURE SURGERY PO LIQD
237.0000 mL | Freq: Two times a day (BID) | ORAL | Status: DC
  Administered 2020-10-24 – 2020-10-26 (×2): 237 mL via ORAL

## 2020-10-23 MED ORDER — PHENYLEPHRINE 40 MCG/ML (10ML) SYRINGE FOR IV PUSH (FOR BLOOD PRESSURE SUPPORT)
PREFILLED_SYRINGE | INTRAVENOUS | Status: AC
  Filled 2020-10-23: qty 10

## 2020-10-23 MED ORDER — METRONIDAZOLE 500 MG PO TABS: 1000.0000 mg | ORAL_TABLET | ORAL | Status: DC

## 2020-10-23 MED ORDER — CHLORHEXIDINE GLUCONATE CLOTH 2 % EX PADS: 6.0000 | MEDICATED_PAD | Freq: Once | CUTANEOUS | Status: DC

## 2020-10-23 MED ORDER — INFLUENZA VAC SPLIT QUAD 0.5 ML IM SUSY
0.5000 mL | PREFILLED_SYRINGE | INTRAMUSCULAR | Status: AC
  Administered 2020-10-26: 10:00:00 0.5 mL via INTRAMUSCULAR
  Filled 2020-10-23: qty 0.5

## 2020-10-23 MED ORDER — ACETAMINOPHEN 500 MG PO TABS
1000.0000 mg | ORAL_TABLET | ORAL | Status: AC
  Administered 2020-10-23: 1000 mg via ORAL
  Filled 2020-10-23: qty 2

## 2020-10-23 MED ORDER — IBUPROFEN 400 MG PO TABS
600.0000 mg | ORAL_TABLET | Freq: Four times a day (QID) | ORAL | Status: DC | PRN
  Administered 2020-10-23: 600 mg via ORAL
  Filled 2020-10-23: qty 1

## 2020-10-23 MED ORDER — DIPHENHYDRAMINE HCL 12.5 MG/5ML PO ELIX: 12.5000 mg | ORAL_SOLUTION | Freq: Four times a day (QID) | ORAL | Status: DC | PRN

## 2020-10-23 MED ORDER — DIPHENHYDRAMINE HCL 50 MG/ML IJ SOLN: 12.5000 mg | Freq: Four times a day (QID) | INTRAMUSCULAR | Status: DC | PRN

## 2020-10-23 MED ORDER — PRAVASTATIN SODIUM 20 MG PO TABS
40.0000 mg | ORAL_TABLET | Freq: Every day | ORAL | Status: DC
  Administered 2020-10-24 – 2020-10-26 (×3): 40 mg via ORAL
  Filled 2020-10-23 (×3): qty 2

## 2020-10-23 MED ORDER — AMLODIPINE-OLMESARTAN 10-40 MG PO TABS: 1.0000 | ORAL_TABLET | Freq: Every day | ORAL | Status: DC

## 2020-10-23 MED ORDER — HEPARIN SODIUM (PORCINE) 5000 UNIT/ML IJ SOLN
INTRAMUSCULAR | Status: AC
  Administered 2020-10-23: 5000 [IU] via SUBCUTANEOUS
  Filled 2020-10-23: qty 1

## 2020-10-23 MED ORDER — FENTANYL CITRATE (PF) 100 MCG/2ML IJ SOLN
INTRAMUSCULAR | Status: AC
  Filled 2020-10-23: qty 2

## 2020-10-23 MED ORDER — LIDOCAINE 2% (20 MG/ML) 5 ML SYRINGE
INTRAMUSCULAR | Status: DC | PRN
  Administered 2020-10-23: 80 mg via INTRAVENOUS

## 2020-10-23 MED ORDER — SODIUM CHLORIDE 0.9 % IV SOLN
2.0000 g | INTRAVENOUS | Status: AC
  Administered 2020-10-23: 2 g via INTRAVENOUS

## 2020-10-23 MED ORDER — LACTATED RINGERS IV SOLN: INTRAVENOUS | Status: DC | PRN

## 2020-10-23 MED ORDER — SIMETHICONE 80 MG PO CHEW: 40.0000 mg | CHEWABLE_TABLET | Freq: Four times a day (QID) | ORAL | Status: DC | PRN

## 2020-10-23 MED ORDER — BUPIVACAINE LIPOSOME 1.3 % IJ SUSP
INTRAMUSCULAR | Status: DC | PRN
  Administered 2020-10-23: 20 mL

## 2020-10-23 MED ORDER — SUGAMMADEX SODIUM 200 MG/2ML IV SOLN
INTRAVENOUS | Status: DC | PRN
  Administered 2020-10-23: 260 mg via INTRAVENOUS

## 2020-10-23 MED ORDER — IRBESARTAN 150 MG PO TABS
300.0000 mg | ORAL_TABLET | Freq: Every day | ORAL | Status: DC
  Administered 2020-10-24 – 2020-10-26 (×3): 300 mg via ORAL
  Filled 2020-10-23 (×3): qty 2

## 2020-10-23 MED ORDER — PROPOFOL 10 MG/ML IV BOLUS
INTRAVENOUS | Status: DC | PRN
  Administered 2020-10-23: 50 mg via INTRAVENOUS
  Administered 2020-10-23: 200 mg via INTRAVENOUS

## 2020-10-23 MED ORDER — OXYCODONE HCL 5 MG/5ML PO SOLN: 5.0000 mg | Freq: Once | ORAL | Status: DC | PRN

## 2020-10-23 MED ORDER — LIDOCAINE 2% (20 MG/ML) 5 ML SYRINGE
INTRAMUSCULAR | Status: AC
  Filled 2020-10-23: qty 5

## 2020-10-23 MED ORDER — MIDAZOLAM HCL 2 MG/2ML IJ SOLN
INTRAMUSCULAR | Status: AC
  Filled 2020-10-23: qty 2

## 2020-10-23 MED ORDER — ENSURE PRE-SURGERY PO LIQD
592.0000 mL | Freq: Once | ORAL | Status: DC
  Filled 2020-10-23: qty 592

## 2020-10-23 MED ORDER — 0.9 % SODIUM CHLORIDE (POUR BTL) OPTIME
TOPICAL | Status: DC | PRN
  Administered 2020-10-23: 2000 mL

## 2020-10-23 MED ORDER — DEXAMETHASONE SODIUM PHOSPHATE 10 MG/ML IJ SOLN
INTRAMUSCULAR | Status: DC | PRN
  Administered 2020-10-23: 10 mg via INTRAVENOUS

## 2020-10-23 MED ORDER — NEBIVOLOL HCL 5 MG PO TABS
5.0000 mg | ORAL_TABLET | Freq: Every day | ORAL | Status: DC
  Administered 2020-10-24 – 2020-10-25 (×2): 5 mg via ORAL
  Filled 2020-10-23 (×3): qty 1

## 2020-10-23 MED ORDER — HEPARIN SODIUM (PORCINE) 5000 UNIT/ML IJ SOLN
5000.0000 [IU] | Freq: Three times a day (TID) | INTRAMUSCULAR | Status: DC
  Administered 2020-10-23 – 2020-10-26 (×8): 5000 [IU] via SUBCUTANEOUS
  Filled 2020-10-23 (×8): qty 1

## 2020-10-23 MED ORDER — CHLORHEXIDINE GLUCONATE 0.12 % MT SOLN
15.0000 mL | Freq: Once | OROMUCOSAL | Status: AC
  Administered 2020-10-23: 15 mL via OROMUCOSAL

## 2020-10-23 MED ORDER — FENTANYL CITRATE (PF) 250 MCG/5ML IJ SOLN
INTRAMUSCULAR | Status: DC | PRN
  Administered 2020-10-23 (×3): 50 ug via INTRAVENOUS
  Administered 2020-10-23: 100 ug via INTRAVENOUS

## 2020-10-23 MED ORDER — LACTATED RINGERS IR SOLN
Status: DC | PRN
  Administered 2020-10-23: 1000 mL

## 2020-10-23 MED ORDER — PROPOFOL 10 MG/ML IV BOLUS
INTRAVENOUS | Status: AC
  Filled 2020-10-23: qty 20

## 2020-10-23 MED ORDER — ONDANSETRON HCL 4 MG/2ML IJ SOLN: 4.0000 mg | Freq: Once | INTRAMUSCULAR | Status: DC | PRN

## 2020-10-23 MED ORDER — SODIUM CHLORIDE 0.9 % IV SOLN
INTRAVENOUS | Status: AC
  Filled 2020-10-23: qty 2

## 2020-10-23 MED ORDER — LACTATED RINGERS IV SOLN
INTRAVENOUS | Status: DC
  Administered 2020-10-23: 1000 mL via INTRAVENOUS

## 2020-10-23 MED ORDER — BUPIVACAINE-EPINEPHRINE 0.5% -1:200000 IJ SOLN
INTRAMUSCULAR | Status: AC
  Filled 2020-10-23: qty 1

## 2020-10-23 MED ORDER — ALVIMOPAN 12 MG PO CAPS
12.0000 mg | ORAL_CAPSULE | ORAL | Status: AC
  Administered 2020-10-23: 12 mg via ORAL
  Filled 2020-10-23: qty 1

## 2020-10-23 MED ORDER — MIDAZOLAM HCL 2 MG/2ML IJ SOLN
INTRAMUSCULAR | Status: DC | PRN
  Administered 2020-10-23: 2 mg via INTRAVENOUS

## 2020-10-23 MED ORDER — ONDANSETRON HCL 4 MG/2ML IJ SOLN: 4.0000 mg | Freq: Four times a day (QID) | INTRAMUSCULAR | Status: DC | PRN

## 2020-10-23 MED ORDER — TRAMADOL HCL 50 MG PO TABS
50.0000 mg | ORAL_TABLET | Freq: Four times a day (QID) | ORAL | Status: DC | PRN
  Administered 2020-10-24 – 2020-10-26 (×6): 50 mg via ORAL
  Filled 2020-10-23 (×6): qty 1

## 2020-10-23 MED ORDER — ORAL CARE MOUTH RINSE: 15.0000 mL | Freq: Once | OROMUCOSAL | Status: AC

## 2020-10-23 MED ORDER — ONDANSETRON HCL 4 MG/2ML IJ SOLN
INTRAMUSCULAR | Status: DC | PRN
  Administered 2020-10-23: 4 mg via INTRAVENOUS

## 2020-10-23 MED ORDER — OXYCODONE HCL 5 MG PO TABS: 5.0000 mg | ORAL_TABLET | Freq: Once | ORAL | Status: DC | PRN

## 2020-10-23 MED ORDER — HYDROCHLOROTHIAZIDE 25 MG PO TABS
25.0000 mg | ORAL_TABLET | Freq: Every day | ORAL | Status: DC
  Administered 2020-10-24 – 2020-10-26 (×3): 25 mg via ORAL
  Filled 2020-10-23 (×3): qty 1

## 2020-10-23 MED ORDER — ACETAMINOPHEN 500 MG PO TABS
1000.0000 mg | ORAL_TABLET | Freq: Four times a day (QID) | ORAL | Status: DC
  Administered 2020-10-23 – 2020-10-26 (×11): 1000 mg via ORAL
  Filled 2020-10-23 (×11): qty 2

## 2020-10-23 MED FILL — traMADol HCL 50 MG TABS: 50 | 3 days supply | Qty: 15 | Fill #0

## 2020-10-23 SURGICAL SUPPLY — 57 items
APPLIER CLIP ROT 10 11.4 M/L (STAPLE)
BLADE CLIPPER SURG (BLADE) ×2 IMPLANT
CABLE HIGH FREQUENCY MONO STRZ (ELECTRODE) IMPLANT
CELLS DAT CNTRL 66122 CELL SVR (MISCELLANEOUS) IMPLANT
CHLORAPREP W/TINT 26 (MISCELLANEOUS) ×2 IMPLANT
CLIP APPLIE ROT 10 11.4 M/L (STAPLE) IMPLANT
COVER WAND RF STERILE (DRAPES) IMPLANT
DECANTER SPIKE VIAL GLASS SM (MISCELLANEOUS) ×2 IMPLANT
DISSECTOR BLUNT TIP ENDO 5MM (MISCELLANEOUS) IMPLANT
DRSG OPSITE POSTOP 4X6 (GAUZE/BANDAGES/DRESSINGS) ×2 IMPLANT
ELECT REM PT RETURN 15FT ADLT (MISCELLANEOUS) ×2 IMPLANT
GAUZE SPONGE 4X4 12PLY STRL (GAUZE/BANDAGES/DRESSINGS) ×2 IMPLANT
GLOVE BIO SURGEON STRL SZ7.5 (GLOVE) ×4 IMPLANT
GLOVE ECLIPSE 8.0 STRL XLNG CF (GLOVE) ×4 IMPLANT
GOWN STRL REUS W/TWL XL LVL3 (GOWN DISPOSABLE) ×8 IMPLANT
KIT TURNOVER KIT A (KITS) ×2 IMPLANT
LIGASURE IMPACT 36 18CM CVD LR (INSTRUMENTS) IMPLANT
NS IRRIG 1000ML POUR BTL (IV SOLUTION) ×4 IMPLANT
PACK COLON (CUSTOM PROCEDURE TRAY) ×2 IMPLANT
PAD POSITIONING PINK XL (MISCELLANEOUS) ×2 IMPLANT
PENCIL SMOKE EVACUATOR (MISCELLANEOUS) IMPLANT
PROTECTOR NERVE ULNAR (MISCELLANEOUS) IMPLANT
RELOAD PROXIMATE 75MM BLUE (ENDOMECHANICALS) ×2 IMPLANT
RTRCTR WOUND ALEXIS 18CM MED (MISCELLANEOUS)
SCISSORS LAP 5X35 DISP (ENDOMECHANICALS) ×2 IMPLANT
SEALER TISSUE G2 STRG ARTC 35C (ENDOMECHANICALS) ×2 IMPLANT
SET IRRIG TUBING LAPAROSCOPIC (IRRIGATION / IRRIGATOR) ×2 IMPLANT
SET TUBE SMOKE EVAC HIGH FLOW (TUBING) ×2 IMPLANT
SHEARS HARMONIC ACE PLUS 36CM (ENDOMECHANICALS) IMPLANT
SLEEVE ADV FIXATION 5X100MM (TROCAR) ×4 IMPLANT
SLEEVE ENDOPATH XCEL 5M (ENDOMECHANICALS) ×2 IMPLANT
STAPLER 90 3.5 STAND SLIM (STAPLE) ×2
STAPLER 90 3.5 STD SLIM (STAPLE) ×1 IMPLANT
STAPLER GUN LINEAR PROX 60 (STAPLE) IMPLANT
STAPLER PROXIMATE 75MM BLUE (STAPLE) ×2 IMPLANT
STAPLER VISISTAT 35W (STAPLE) ×2 IMPLANT
SUT MNCRL AB 4-0 PS2 18 (SUTURE) ×2 IMPLANT
SUT PDS AB 1 CT1 27 (SUTURE) ×4 IMPLANT
SUT PROLENE 2 0 CT2 30 (SUTURE) IMPLANT
SUT PROLENE 2 0 KS (SUTURE) IMPLANT
SUT SILK 2 0 (SUTURE) ×2
SUT SILK 2 0 SH CR/8 (SUTURE) ×2 IMPLANT
SUT SILK 2-0 18XBRD TIE 12 (SUTURE) ×1 IMPLANT
SUT SILK 3 0 (SUTURE) ×2
SUT SILK 3 0 SH CR/8 (SUTURE) ×2 IMPLANT
SUT SILK 3-0 18XBRD TIE 12 (SUTURE) ×1 IMPLANT
SYS LAPSCP GELPORT 120MM (MISCELLANEOUS)
SYSTEM LAPSCP GELPORT 120MM (MISCELLANEOUS) IMPLANT
TAPE CLOTH 4X10 WHT NS (GAUZE/BANDAGES/DRESSINGS) IMPLANT
TOWEL OR 17X26 10 PK STRL BLUE (TOWEL DISPOSABLE) ×2 IMPLANT
TRAY FOLEY MTR SLVR 14FR STAT (SET/KITS/TRAYS/PACK) ×2 IMPLANT
TRAY FOLEY MTR SLVR 16FR STAT (SET/KITS/TRAYS/PACK) IMPLANT
TROCAR ADV FIXATION 5X100MM (TROCAR) ×2 IMPLANT
TROCAR BALLN 12MMX100 BLUNT (TROCAR) ×2 IMPLANT
TROCAR XCEL NON-BLD 11X100MML (ENDOMECHANICALS) IMPLANT
TUBING CONNECTING 10 (TUBING) ×4 IMPLANT
YANKAUER SUCT BULB TIP NO VENT (SUCTIONS) ×2 IMPLANT

## 2020-10-23 NOTE — Op Note (Signed)
10/23/2020  1:41 PM  PATIENT:  Christian Perez  49 y.o. male  Patient Care Team: Shella Spearing, PA-C as PCP - General  PRE-OPERATIVE DIAGNOSIS:  Colon cancer, cecum  POST-OPERATIVE DIAGNOSIS:  Same  PROCEDURE:  Laparoscopic right hemicolectomy  SURGEON:  Sharon Mt. Dema Severin, MD  ASSISTANT: Michael Boston, MD  ANESTHESIA:   local and general  COUNTS:  Sponge, needle and instrument counts were reported correct x2 at the conclusion of the operation.  EBL: 50 mL  DRAINS: None  SPECIMEN: Right colon  COMPLICATIONS: None  FINDINGS: Mass palpable in cecum. Right hemicolectomy carried out. Ileal-transverse colon anastomosis. Specimen includes right branch of middle colic vessel.  DISPOSITION: PACU in satisfactory condition  DESCRIPTION: The patient was identified in preop holding and taken to the OR where he was placed on the operating room table. SCDs were placed. General endotracheal anesthesia was induced without difficulty. Hair on the abdomen was clipped. A foley catheter was placed under sterile conditions. Arms were then padded and tucked. Pressure points were padded. He was then prepped and draped in the usual sterile fashion. A surgical timeout was performed indicating the correct patient, procedure, positioning and need for preoperative antibiotics.   Beginning with the extraction site, the supraumbilical skin incision was created.  This was carried down to the level of fascia.  The fascia was incised the midline.  The peritoneum was carefully entered bluntly.  An [place] wound protector was placed.  Pneumoperitoneum was established after placing a cap on the wound protector.  Pressure 50 mmHg CO2 was obtained.  Laparoscope was inserted and demonstrated significant amounts of intra-abdominal adiposity.  There was an omental blanket coating the entire abdomen.  There was some fatty changes associate with the liver but no evident masses.  The peritoneal surface was smooth.  This  was retracted up above the liver.  Bilateral transversus abdominis plane blocks were carried out with a mixture of Exparel and Marcaine.  Two 5 mm ports were placed in the left hemiabdomen and 1 in the right hemiabdomen all under direct visualization.  The omentum was placed above the liver.  He was placed in Trendelenburg with some right side up.  The terminal ileum was identified and adherent to the right lower quadrant.  This was carefully mobilized off the retroperitoneum taking care to preserve the right gonadal's and ureter.  A lateral to medial approach was employed given his degree of adiposity.  His mesentery and omentum was quite friable.  The cecum and ascending colon were mobilized in a lateral to medial fashion by incising the Glendene Wyer line of [place].  As the hepatic flexure was approached, he was repositioned in reverse Trendelenburg.  The omentum was grasped and elevated anteriorly and the transverse colon retracted inferiorly.  The lesser sac was gained on the proximal third of the transverse colon.  The omentum was carefully dissected away from the transverse colon.  The hepatic flexure was approached from this approach and mobilized medially.  The duodenum was identified and protected free of injury.  He was repositioned in Trendelenburg.  The appendix was grasped and was able to be retracted in the left upper quadrant.  All flimsy attachments of the mesocolon to the duodenum were freed.  The ileocolic pedicle at this point was readily apparent.  The peritoneum overlying the mesentery at this location was incised.  The ileocolic pedicle was circumferentially dissected.  The location of the duodenum was again confirmed to be well away from our field of dissection.  The ileocolic pedicle was then divided using the vessel sealer.  The stump was inspected and noted to be hemostatic.  At this point there was more enough mobility to allow the colon to reach to the extraction site.  With the wound protector  in place, the colon was delivered onto the field.  The terminal ileum was identified.  A window was created in the mesentery at this location and the terminal ileum was divided using a 75 mm GIA stapler.  A Babcock clamp was placed onto the ileal staple line to maintain orientation during the remainder of the procedure.  There was a mass palpable in the cecum.  The right colon was eviscerated.  The middle colic pedicle was identified.  Including the right branch the middle colic, the right branch was ligated with the Enseal device.  The main middle colic pedicle remains in situ.  At this level, the transverse colon was divided using a 75 mm GIA stapler.  There was excellent perfusion at the staple line and 3-0 silk U stitches were used to assist with hemostasis at this location.  The intervening mesentery was divided using the Enseal device.  The specimen was passed off.  Attention was turned to creating the anastomosis.  Orientation was confirmed on the distal ileum such that there is no twisting.  An enterotomy and colotomy were created just beyond the respective staple lines.  A 75 mm blue load stapler was then used to create the anastomosis on the antimesenteric border of the small bowel and the antimesenteric tinea of the colon.  The anastomosis was inspected and hemostatic.  There was both a palpable pulse in the distal ileal mesentery as well as the colon this location.  The common enterotomy was then closed using a TA 90 blue load stapler.  The corners of the staple line were dunked with 3-0 silk suture.  The anastomosis was palpated and widely patent.  A 3-0 silk "crotch" suture was placed.  This was then placed back into the abdomen.  Pneumoperitoneum was reestablished and inspection demonstrated a hemostatic anastomosis/abdomen.  Omentum was brought down over the abdominal viscera.  The 5 mm ports were removed under direct visualization and the sites hemostatic.  We then switched over to the clean  portion of the procedure.  All equipment was passed off.  New gloves/gowns/equipment was then used.  Additional drapes were placed from the field.  The midline fascia of the extraction site was closed with 2 running #1 PDS sutures tied centrally.  All sponge, needle, and instrument counts were reported correct x2.  The skin of all incision sites was closed with 4-0 Monocryl subcuticular suture.  Dermabond was applied to the incisions.  He was then awakened from anesthesia, extubated, and transferred to a stretcher for transport to PACU in satisfactory condition.

## 2020-10-23 NOTE — Anesthesia Procedure Notes (Addendum)
Procedure Name: Intubation Date/Time: 10/23/2020 11:58 AM Performed by: Eben Burow, CRNA Pre-anesthesia Checklist: Patient identified, Emergency Drugs available, Suction available and Patient being monitored Patient Re-evaluated:Patient Re-evaluated prior to induction Oxygen Delivery Method: Circle system utilized Preoxygenation: Pre-oxygenation with 100% oxygen Induction Type: IV induction Ventilation: Mask ventilation without difficulty and Oral airway inserted - appropriate to patient size Laryngoscope Size: Miller and 3 Grade View: Grade I Tube type: Oral Tube size: 8.0 mm Number of attempts: 1 Airway Equipment and Method: Stylet and Oral airway Placement Confirmation: ETT inserted through vocal cords under direct vision,  positive ETCO2 and breath sounds checked- equal and bilateral Secured at: 26 cm Tube secured with: Tape Dental Injury: Teeth and Oropharynx as per pre-operative assessment  Comments: Placed by Hilda Blades

## 2020-10-23 NOTE — Anesthesia Preprocedure Evaluation (Addendum)
Anesthesia Evaluation  Patient identified by MRN, date of birth, ID band Patient awake    Reviewed: Allergy & Precautions, NPO status , Patient's Chart, lab work & pertinent test results  History of Anesthesia Complications Negative for: history of anesthetic complications  Airway Mallampati: III  TM Distance: >3 FB Neck ROM: Full    Dental  (+) Missing   Pulmonary sleep apnea and Continuous Positive Airway Pressure Ventilation , Current Smoker and Patient abstained from smoking.,    Pulmonary exam normal        Cardiovascular hypertension, Pt. on medications Normal cardiovascular exam     Neuro/Psych negative neurological ROS  negative psych ROS   GI/Hepatic Neg liver ROS, Cecal carcinoid tumor   Endo/Other  negative endocrine ROS  Renal/GU negative Renal ROS  negative genitourinary   Musculoskeletal negative musculoskeletal ROS (+)   Abdominal   Peds  Hematology negative hematology ROS (+)   Anesthesia Other Findings  Recent COVID infection (09/15/20)  Reproductive/Obstetrics                            Anesthesia Physical Anesthesia Plan  ASA: II  Anesthesia Plan: General   Post-op Pain Management:    Induction: Intravenous  PONV Risk Score and Plan: 2 and Ondansetron, Dexamethasone, Treatment may vary due to age or medical condition and Midazolam  Airway Management Planned: Oral ETT  Additional Equipment: None  Intra-op Plan:   Post-operative Plan: Extubation in OR  Informed Consent: I have reviewed the patients History and Physical, chart, labs and discussed the procedure including the risks, benefits and alternatives for the proposed anesthesia with the patient or authorized representative who has indicated his/her understanding and acceptance.     Dental advisory given  Plan Discussed with:   Anesthesia Plan Comments:        Anesthesia Quick Evaluation

## 2020-10-23 NOTE — Anesthesia Postprocedure Evaluation (Signed)
Anesthesia Post Note  Patient: Christian Perez  Procedure(s) Performed: LAPAROSCOPIC RIGHT HEMI COLECTOMY WITH TAP BLOCK (Right Abdomen)     Patient location during evaluation: PACU Anesthesia Type: General Level of consciousness: awake and alert Pain management: pain level controlled Vital Signs Assessment: post-procedure vital signs reviewed and stable Respiratory status: spontaneous breathing, nonlabored ventilation, respiratory function stable and patient connected to nasal cannula oxygen Cardiovascular status: blood pressure returned to baseline and stable Postop Assessment: no apparent nausea or vomiting Anesthetic complications: no   No complications documented.  Last Vitals:  Vitals:   10/23/20 1415 10/23/20 1430  BP: (!) 156/85 (!) 149/91  Pulse: 86 91  Resp: 11 11  Temp:  36.6 C  SpO2: 100% 100%    Last Pain:  Vitals:   10/23/20 1430  TempSrc:   PainSc: Asleep                 Mayanna Garlitz L Adalberto Metzgar

## 2020-10-23 NOTE — Transfer of Care (Signed)
Immediate Anesthesia Transfer of Care Note  Patient: Christian Perez  Procedure(s) Performed: LAPAROSCOPIC RIGHT HEMI COLECTOMY WITH TAP BLOCK (Right Abdomen)  Patient Location: PACU  Anesthesia Type:General  Level of Consciousness: awake, alert  and patient cooperative  Airway & Oxygen Therapy: Patient Spontanous Breathing and Patient connected to face mask oxygen  Post-op Assessment: Report given to RN and Post -op Vital signs reviewed and stable  Post vital signs: Reviewed and stable  Last Vitals:  Vitals Value Taken Time  BP    Temp    Pulse 108 10/23/20 1352  Resp 13 10/23/20 1352  SpO2 100 % 10/23/20 1352  Vitals shown include unvalidated device data.  Last Pain:  Vitals:   10/23/20 0942  TempSrc:   PainSc: 0-No pain         Complications: No complications documented.

## 2020-10-23 NOTE — H&P (Signed)
CC: Here today for surgery  HPI: Christian Perez is a very pleasant 49yoM with hx of HTN, HLD who underwent his first screening colonoscopy at [facility] - 07/02/20 - was found to have a 3 cm mass in the cecum. This was biopsied and returned as a neuroendocrine tumor (carcinoid). He ultimately underwent a CT abdomen and pelvis for staging purposes at the end of August. This demonstrated a 2.5 cm mass in the cecum abutting the soft tissue medially. There is no evidence of metastatic disease noted. All we have is a copy of the report. We do not have physical images. Did not have a CT of the chest performed yet. He denies any symptoms related to this. He denies any history of rectal bleeding or abdominal pain. He states his colonoscopy was for screening purposes only.  His surgery was initially scheduled but subsequently delayed as he unfortunately developed symptomatic COVID days before his surgery. He recovered uneventfully and has been doing well. He is ready for his surgery. Denies any complaints at this time. Feels well.   PMH: HTN, HLD  PSH: Right wrist surgery; denies any abdominal/pelvic procedures  FHx: Only known family hx of malignancy is maternal grandmother may have had breast cancer  Social: Denies use of drugs; smokes 1 cigar per week; drinks EtOH once per week. Works in the [employer] as a Designer, industrial/product. Has 2 children - 51 yo son and 47 yo daughter  ROS: A comprehensive 10 system review of systems was completed with the patient and pertinent findings as noted above.  Past Medical History:  Diagnosis Date  . COVID-19 09/15/2020  . Hypertension   . Sleep apnea    cpap- seting at 12     Past Surgical History:  Procedure Laterality Date  . right wrist surgery       History reviewed. No pertinent family history.  Social:  reports that he has been smoking cigars. He has never used smokeless tobacco. He reports current alcohol use. He  reports that he does not use drugs.  Allergies: No Known Allergies  Medications: I have reviewed the patient's current medications.  No results found for this or any previous visit (from the past 48 hour(s)).  No results found.  ROS - all of the below systems have been reviewed with the patient and positives are indicated with bold text General: chills, fever or night sweats Eyes: blurry vision or double vision ENT: epistaxis or sore throat Allergy/Immunology: itchy/watery eyes or nasal congestion Hematologic/Lymphatic: bleeding problems, blood clots or swollen lymph nodes Endocrine: temperature intolerance or unexpected weight changes Breast: new or changing breast lumps or nipple discharge Resp: cough, shortness of breath, or wheezing CV: chest pain or dyspnea on exertion GI: as per HPI GU: dysuria, trouble voiding, or hematuria MSK: joint pain or joint stiffness Neuro: TIA or stroke symptoms Derm: pruritus and skin lesion changes Psych: anxiety and depression  PE Blood pressure (!) 141/75, pulse 65, temperature 98.2 F (36.8 C), temperature source Oral, resp. rate 17, height 6\' 7"  (2.007 m), weight 131.7 kg, SpO2 100 %. Constitutional: NAD; conversant; no deformities Eyes: Moist conjunctiva; anicteric Lungs: Normal respiratory effort CV: RRR; no pitting edema GI: Abd soft, NT/ND; no palpable hepatosplenomegaly MSK: Normal range of motion of extremities Psychiatric: Appropriate affect; alert and oriented x3  No results found for this or any previous visit (from the past 48 hour(s)).  No results found.   A/P: Mr. Kise is a very pleasant 49yoM with  newly diagnosed cecal neuroendocrine tumor (carcinoid) - found on screening colonoscopy back 07/02/20.  CT AP through [place] [facility] [facility] showed no evidence of metastasis intra-abdominally; we do not have CT chest yet though  -The anatomy and physiology of the GI tract was discussed at length with the patient. The  pathophysiology of colon masses and NETs was discussed at length with associated pictures.  -CT Chest showed no evidence of metastatic disease. We were able to review CT A/P - images sent on CD to our office.   -We have reviewed laparoscopic and potential open techniques to a right hemicolectomy.  -The planned procedure, material risks (including, but not limited to, pain, bleeding, infection, scarring, need for blood transfusion, damage to surrounding structures- blood vessels/nerves/viscus/organs, damage to ureter, urine leak, leak from anastomosis, need for additional procedures, worsening of pre-existing medical conditions, need for stoma which may be permanent, hernia, recurrence, DVT/PE, pneumonia, heart attack, stroke, death) benefits and alternatives to surgery were discussed at length. The patient's questions were answered to his satisfaction, he voiced understanding and elected to proceed with surgery. Additionally, we discussed typical postoperative expectations and the recovery process - including time away from work expectations and recovery therein.  Christian Perez, M.D. [facility] Use [email or web address] to contact on call provider

## 2020-10-23 NOTE — Discharge Instructions (Signed)
POST OP INSTRUCTIONS AFTER COLON SURGERY  1. DIET: Be sure to include lots of fluids daily to stay hydrated - 64oz of water per day (8, 8 oz glasses).  Avoid fast food or heavy meals for the first couple of weeks as your are more likely to get nauseated. Avoid raw/uncooked fruits or vegetables for the first 4 weeks (its ok to have these if they are blended into smoothie form). If you have fruits/vegetables, make sure they are cooked until soft enough to mash on the roof of your mouth and chew your food well. Otherwise, diet as tolerated.  2. Take your usually prescribed home medications unless otherwise directed.  3. PAIN CONTROL: a. Pain is best controlled by a usual combination of three different methods TOGETHER: i. Ice/Heat ii. Over the counter pain medication iii. Prescription pain medication b. Most patients will experience some swelling and bruising around the surgical site.  Ice packs or heating pads (30-60 minutes up to 6 times a day) will help. Some people prefer to use ice alone, heat alone, alternating between ice & heat.  Experiment to what works for you.  Swelling and bruising can take several weeks to resolve.   c. It is helpful to take an over-the-counter pain medication regularly for the first few weeks: i. Ibuprofen (Motrin/Advil) - 200mg tabs - take 3 tabs (600mg) every 6 hours as needed for pain (unless you have been directed previously to avoid NSAIDs/ibuprofen) ii. Acetaminophen (Tylenol) - you may take 650mg every 6 hours as needed. You can take this with motrin as they act differently on the body. If you are taking a narcotic pain medication that has acetaminophen in it, do not take over the counter tylenol at the same time. iii. NOTE: You may take both of these medications together - most patients  find it most helpful when alternating between the two (i.e. Ibuprofen at 6am, tylenol at 9am, ibuprofen at 12pm ...) d. A  prescription for pain medication should be given to you  upon discharge.  Take your pain medication as prescribed if your pain is not adequatly controlled with the over-the-counter pain reliefs mentioned above.  4. Avoid getting constipated.  Between the surgery and the pain medications, it is common to experience some constipation.  Increasing fluid intake and taking a fiber supplement (such as Metamucil, Citrucel, FiberCon, MiraLax, etc) 1-2 times a day regularly will usually help prevent this problem from occurring.  A mild laxative (prune juice, Milk of Magnesia, MiraLax, etc) should be taken according to package directions if there are no bowel movements after 48 hours.    5. Dressing: Your incisions are covered in Dermabond which is like sterile superglue for the skin. This will come off on it's own in a couple weeks. It is waterproof and you may bathe normally starting the day after your surgery in a shower. Avoid baths/pools/lakes/oceans until your wounds have fully healed.  6. ACTIVITIES as tolerated:   a. Avoid heavy lifting (>10lbs or 1 gallon of milk) for the next 6 weeks. b. You may resume regular daily activities as tolerated--such as daily self-care, walking, climbing stairs--gradually increasing activities as tolerated.  If you can walk 30 minutes without difficulty, it is safe to try more intense activity such as jogging, treadmill, bicycling, low-impact aerobics.  c. DO NOT PUSH THROUGH PAIN.  Let pain be your guide: If it hurts to do something, don't do it. d. You may drive when you are no longer taking prescription pain medication, you   can comfortably wear a seatbelt, and you can safely maneuver your car and apply brakes.  7. FOLLOW UP in our office a. Please call [facility] at [phone] to set up an appointment to see your surgeon in the office for a follow-up appointment approximately 2 weeks after your surgery. b. Make sure that you call for this appointment the day you arrive home to insure a convenient appointment time.  9. If you  have disability or family leave forms that need to be completed, you may have them completed by your primary care physician's office; for return to work instructions, please ask our office staff and they will be happy to assist you in obtaining this documentation   When to call us [phone]: 1. Poor pain control 2. Reactions / problems with new medications (rash/itching, etc)  3. Fever over 101.5 F (38.5 C) 4. Inability to urinate 5. Nausea/vomiting 6. Worsening swelling or bruising 7. Continued bleeding from incision. 8. Increased pain, redness, or drainage from the incision  The clinic staff is available to answer your questions during regular business hours (8:30am-5pm).  Please don't hesitate to call and ask to speak to one of our nurses for clinical concerns.   A surgeon from [facility] is always on call at the hospitals   If you have a medical emergency, go to the nearest emergency room or call 911.  [facility] [address], [address], [place], [place]  [address] MAIN: [phone] FAX: [phone] [email or web address] 

## 2020-10-24 ENCOUNTER — Encounter (HOSPITAL_COMMUNITY): Payer: Self-pay | Admitting: Surgery

## 2020-10-24 LAB — CBC
HCT: 41.4 % (ref 39.0–52.0)
Hemoglobin: 13.4 g/dL (ref 13.0–17.0)
MCH: 27.2 pg (ref 26.0–34.0)
MCHC: 32.4 g/dL (ref 30.0–36.0)
MCV: 84 fL (ref 80.0–100.0)
Platelets: 174 10*3/uL (ref 150–400)
RBC: 4.93 MIL/uL (ref 4.22–5.81)
RDW: 13.4 % (ref 11.5–15.5)
WBC: 9 10*3/uL (ref 4.0–10.5)
nRBC: 0 % (ref 0.0–0.2)

## 2020-10-24 LAB — BASIC METABOLIC PANEL
Anion gap: 11 (ref 5–15)
BUN: 16 mg/dL (ref 6–20)
CO2: 22 mmol/L (ref 22–32)
Calcium: 8.6 mg/dL — ABNORMAL LOW (ref 8.9–10.3)
Chloride: 103 mmol/L (ref 98–111)
Creatinine, Ser: 1.22 mg/dL (ref 0.61–1.24)
GFR, Estimated: >60 mL/min (ref >60)
Glucose, Bld: 132 mg/dL — ABNORMAL HIGH (ref 70–99)
Potassium: 3.7 mmol/L (ref 3.5–5.1)
Sodium: 136 mmol/L (ref 135–145)

## 2020-10-24 NOTE — Progress Notes (Signed)
1 Day Post-Op   Subjective/Chief Complaint: Complains of soreness. No nausea and passing flatus   Objective: Vital signs in last 24 hours: Temp:  [97.6 F (36.4 C)-98.2 F (36.8 C)] 97.8 F (36.6 C) (11/06 0510) Pulse Rate:  [65-108] 71 (11/06 0510) Resp:  [11-18] 16 (11/06 0510) BP: (132-171)/(67-97) 141/79 (11/06 0510) SpO2:  [97 %-100 %] 98 % (11/06 0510) Weight:  [131.7 kg] 131.7 kg (11/05 0925) Last BM Date: 10/23/20  Intake/Output from previous day: 11/05 0701 - 11/06 0700 In: 2905 [P.O.:600; I.V.:2205; IV Piggyback:100] Out: 1450 [Urine:1400; Blood:50] Intake/Output this shift: No intake/output data recorded.  General appearance: alert and cooperative Resp: clear to auscultation bilaterally Cardio: regular rate and rhythm GI: soft, appropriately tender. incisions look good. good bs  Lab Results:  Recent Labs    10/24/20 0630  WBC 9.0  HGB 13.4  HCT 41.4  PLT 174   BMET Recent Labs    10/24/20 0630  NA 136  K 3.7  CL 103  CO2 22  GLUCOSE 132*  BUN 16  CREATININE 1.22  CALCIUM 8.6*   PT/INR No results for input(s): LABPROT, INR in the last 72 hours. ABG No results for input(s): PHART, HCO3 in the last 72 hours.  Invalid input(s): PCO2, PO2  Studies/Results: No results found.  Anti-infectives: Anti-infectives (From admission, onward)   Start     Dose/Rate Route Frequency Ordered Stop   10/23/20 1400  neomycin (MYCIFRADIN) tablet 1,000 mg  Status:  Discontinued       "And" Linked Group Details   1,000 mg Oral 3 times per day 10/23/20 0923 10/23/20 0931   10/23/20 1400  metroNIDAZOLE (FLAGYL) tablet 1,000 mg  Status:  Discontinued       "And" Linked Group Details   1,000 mg Oral 3 times per day 10/23/20 7867 10/23/20 0931   10/23/20 0932  sodium chloride 0.9 % with cefoTEtan (CEFOTAN) ADS Med       Note to Pharmacy: Kyra Leyland   : cabinet override      10/23/20 0932 10/23/20 1203   10/23/20 0930  cefoTEtan (CEFOTAN) 2 g in sodium  chloride 0.9 % 100 mL IVPB        2 g 200 mL/hr over 30 Minutes Intravenous On call to O.R. 10/23/20 0923 10/23/20 1155      Assessment/Plan: s/p Procedure(s): LAPAROSCOPIC RIGHT HEMI COLECTOMY WITH TAP BLOCK (Right) Advance diet  Ambulate POD 1  LOS: 1 day    Autumn Messing III 10/24/2020

## 2020-10-24 NOTE — Progress Notes (Signed)
Pharmacy Brief Note - Alvimopan (Entereg)  The standing order set for alvimopan (Entereg) now includes an automatic order to discontinue the drug after the patient has had a bowel movement. The change was approved by the [facility].   -Pt's RN noted/charted that entereg dose at [facility] on 11/6 was not given bc pt "had 2 bowl movements last night."  This patient has had bowel movements documented by nursing. Therefore, alvimopan has been discontinued. If there are questions, please contact the pharmacy at [phone].   Thank you-  Dia Sitter, PharmD, BCPS 10/24/2020 1:45 PM

## 2020-10-25 LAB — CBC
HCT: 40.8 % (ref 39.0–52.0)
Hemoglobin: 13.2 g/dL (ref 13.0–17.0)
MCH: 27.6 pg (ref 26.0–34.0)
MCHC: 32.4 g/dL (ref 30.0–36.0)
MCV: 85.2 fL (ref 80.0–100.0)
Platelets: 174 10*3/uL (ref 150–400)
RBC: 4.79 MIL/uL (ref 4.22–5.81)
RDW: 13.9 % (ref 11.5–15.5)
WBC: 6.1 10*3/uL (ref 4.0–10.5)
nRBC: 0 % (ref 0.0–0.2)

## 2020-10-25 LAB — BASIC METABOLIC PANEL
Anion gap: 8 (ref 5–15)
BUN: 15 mg/dL (ref 6–20)
CO2: 26 mmol/L (ref 22–32)
Calcium: 8.3 mg/dL — ABNORMAL LOW (ref 8.9–10.3)
Chloride: 106 mmol/L (ref 98–111)
Creatinine, Ser: 1.27 mg/dL — ABNORMAL HIGH (ref 0.61–1.24)
GFR, Estimated: >60 mL/min (ref >60)
Glucose, Bld: 99 mg/dL (ref 70–99)
Potassium: 3.6 mmol/L (ref 3.5–5.1)
Sodium: 140 mmol/L (ref 135–145)

## 2020-10-25 NOTE — Progress Notes (Signed)
2 Days Post-Op   Subjective/Chief Complaint: Feels like he had a bad night. Had a little more pain. Still passing flatus. No fever   Objective: Vital signs in last 24 hours: Temp:  [97.7 F (36.5 C)-98.6 F (37 C)] 98.1 F (36.7 C) (11/07 0530) Pulse Rate:  [57-76] 57 (11/07 0530) Resp:  [17-19] 19 (11/07 0530) BP: (126-140)/(72-93) 135/85 (11/07 0530) SpO2:  [98 %-100 %] 100 % (11/07 0530) Last BM Date: 10/24/20  Intake/Output from previous day: 11/06 0701 - 11/07 0700 In: 2298.2 [P.O.:1440; I.V.:858.2] Out: 3450 [Urine:3450] Intake/Output this shift: No intake/output data recorded.  General appearance: alert and cooperative Resp: clear to auscultation bilaterally Cardio: regular rate and rhythm GI: very soft, mild tenderness. incisions look good. good bs  Lab Results:  Recent Labs    10/24/20 0630 10/25/20 0610  WBC 9.0 6.1  HGB 13.4 13.2  HCT 41.4 40.8  PLT 174 174   BMET Recent Labs    10/24/20 0630 10/25/20 0610  NA 136 140  K 3.7 3.6  CL 103 106  CO2 22 26  GLUCOSE 132* 99  BUN 16 15  CREATININE 1.22 1.27*  CALCIUM 8.6* 8.3*   PT/INR No results for input(s): LABPROT, INR in the last 72 hours. ABG No results for input(s): PHART, HCO3 in the last 72 hours.  Invalid input(s): PCO2, PO2  Studies/Results: No results found.  Anti-infectives: Anti-infectives (From admission, onward)   Start     Dose/Rate Route Frequency Ordered Stop   10/23/20 1400  neomycin (MYCIFRADIN) tablet 1,000 mg  Status:  Discontinued       "And" Linked Group Details   1,000 mg Oral 3 times per day 10/23/20 0923 10/23/20 0931   10/23/20 1400  metroNIDAZOLE (FLAGYL) tablet 1,000 mg  Status:  Discontinued       "And" Linked Group Details   1,000 mg Oral 3 times per day 10/23/20 9767 10/23/20 0931   10/23/20 0932  sodium chloride 0.9 % with cefoTEtan (CEFOTAN) ADS Med       Note to Pharmacy: Kyra Leyland   : cabinet override      10/23/20 0932 10/23/20 1203    10/23/20 0930  cefoTEtan (CEFOTAN) 2 g in sodium chloride 0.9 % 100 mL IVPB        2 g 200 mL/hr over 30 Minutes Intravenous On call to O.R. 10/23/20 0923 10/23/20 1155      Assessment/Plan: s/p Procedure(s): LAPAROSCOPIC RIGHT HEMI COLECTOMY WITH TAP BLOCK (Right) Advance diet  Ambulate Will observe another day  LOS: 2 days    Autumn Messing III 10/25/2020

## 2020-10-26 LAB — CBC
HCT: 44.7 % (ref 39.0–52.0)
Hemoglobin: 14.2 g/dL (ref 13.0–17.0)
MCH: 26.8 pg (ref 26.0–34.0)
MCHC: 31.8 g/dL (ref 30.0–36.0)
MCV: 84.5 fL (ref 80.0–100.0)
Platelets: 184 10*3/uL (ref 150–400)
RBC: 5.29 MIL/uL (ref 4.22–5.81)
RDW: 13.7 % (ref 11.5–15.5)
WBC: 5.5 10*3/uL (ref 4.0–10.5)
nRBC: 0 % (ref 0.0–0.2)

## 2020-10-26 LAB — BASIC METABOLIC PANEL
Anion gap: 6 (ref 5–15)
BUN: 13 mg/dL (ref 6–20)
CO2: 29 mmol/L (ref 22–32)
Calcium: 8.8 mg/dL — ABNORMAL LOW (ref 8.9–10.3)
Chloride: 103 mmol/L (ref 98–111)
Creatinine, Ser: 1.29 mg/dL — ABNORMAL HIGH (ref 0.61–1.24)
GFR, Estimated: >60 mL/min (ref >60)
Glucose, Bld: 107 mg/dL — ABNORMAL HIGH (ref 70–99)
Potassium: 3.7 mmol/L (ref 3.5–5.1)
Sodium: 138 mmol/L (ref 135–145)

## 2020-10-26 NOTE — Progress Notes (Signed)
Subjective No acute events. Feeling well. Tolerating diet, moving bowels, ambulating, pain controlled.  Objective: Vital signs in last 24 hours: Temp:  [97.7 F (36.5 C)-98.7 F (37.1 C)] 97.7 F (36.5 C) (11/08 0531) Pulse Rate:  [58-71] 58 (11/08 0531) Resp:  [18-19] 18 (11/08 0531) BP: (126-150)/(80-93) 129/92 (11/08 0531) SpO2:  [96 %-99 %] 99 % (11/08 0531) Last BM Date: 10/25/20  Intake/Output from previous day: 11/07 0701 - 11/08 0700 In: 1040 [P.O.:1040] Out: 3350 [Urine:3350] Intake/Output this shift: No intake/output data recorded.  Gen: NAD, comfortable CV: RRR Pulm: Normal work of breathing Abd: Soft, not significantly tender nor distended Ext: SCDs in place  Lab Results: CBC  Recent Labs    10/25/20 0610 10/26/20 0508  WBC 6.1 5.5  HGB 13.2 14.2  HCT 40.8 44.7  PLT 174 184   BMET Recent Labs    10/25/20 0610 10/26/20 0508  NA 140 138  K 3.6 3.7  CL 106 103  CO2 26 29  GLUCOSE 99 107*  BUN 15 13  CREATININE 1.27* 1.29*  CALCIUM 8.3* 8.8*   PT/INR No results for input(s): LABPROT, INR in the last 72 hours. ABG No results for input(s): PHART, HCO3 in the last 72 hours.  Invalid input(s): PCO2, PO2  Studies/Results:  Anti-infectives: Anti-infectives (From admission, onward)   Start     Dose/Rate Route Frequency Ordered Stop   10/23/20 1400  neomycin (MYCIFRADIN) tablet 1,000 mg  Status:  Discontinued       "And" Linked Group Details   1,000 mg Oral 3 times per day 10/23/20 0923 10/23/20 0931   10/23/20 1400  metroNIDAZOLE (FLAGYL) tablet 1,000 mg  Status:  Discontinued       "And" Linked Group Details   1,000 mg Oral 3 times per day 10/23/20 0102 10/23/20 0931   10/23/20 0932  sodium chloride 0.9 % with cefoTEtan (CEFOTAN) ADS Med       Note to Pharmacy: Kyra Leyland   : cabinet override      10/23/20 0932 10/23/20 1203   10/23/20 0930  cefoTEtan (CEFOTAN) 2 g in sodium chloride 0.9 % 100 mL IVPB        2 g 200 mL/hr over 30  Minutes Intravenous On call to O.R. 10/23/20 0923 10/23/20 1155       Assessment/Plan: Patient Active Problem List   Diagnosis Date Noted  . Colon cancer ([place]) 10/23/2020   s/p Procedure(s): LAPAROSCOPIC RIGHT HEMI COLECTOMY WITH TAP BLOCK 10/23/2020  -Doing well; meeting all goals -We have reviewed expectations and recovery and answered his questions -We will plan discharge home today   LOS: 3 days   Nadeen Landau, MD [facility] Use [email or web address] to contact on call provider

## 2020-10-26 NOTE — Discharge Summary (Signed)
Patient ID: Christian Perez MRN: [id number] DOB/AGE: May 04, 1971 49 y.o.  Admit date: 10/23/2020 Discharge date: 10/26/2020  Discharge Diagnoses Patient Active Problem List   Diagnosis Date Noted  . Colon cancer ([place]) 10/23/2020    Procedures OR 10/23/20 laparoscopic right hemicolectomy  Hospital Course: He was admitted postoperatively and recovered appropriately. He began having bowel function, his diet advanced and he was mobilizing well on his own. He was comfortable with and stable for discharge home.    Allergies as of 10/26/2020   No Known Allergies     Medication List    TAKE these medications   amLODipine-olmesartan 10-40 MG tablet Commonly known as: AZOR Take 1 tablet by mouth daily.   Bystolic 5 MG tablet Generic drug: nebivolol Take 5 mg by mouth daily.   ergocalciferol 1.25 MG (50000 UT) capsule Commonly known as: VITAMIN D2 Take 50,000 Units by mouth once a week.   hydrochlorothiazide 25 MG tablet Commonly known as: HYDRODIURIL Take 25 mg by mouth daily.   pravastatin 40 MG tablet Commonly known as: PRAVACHOL Take 40 mg by mouth daily.   traMADol 50 MG tablet Commonly known as: Ultram Take 1 tablet (50 mg total) by mouth every 6 (six) hours as needed for up to 5 days (severe pain not controlled with tylenol and ibuprofen first).         Follow-up Information    Ileana Roup, MD Follow up in 2 week(s).   Specialty: General Surgery Contact information: [place] [id number] [phone]               Tatisha Cerino M. Dema Severin, M.D. [facility]

## 2020-10-27 LAB — SURGICAL PATHOLOGY

## 2020-11-04 ENCOUNTER — Other Ambulatory Visit: Payer: Self-pay

## 2020-11-24 NOTE — Progress Notes (Signed)
[place] NOTE  Patient Care Team: Shella Spearing, Hershal Coria as PCP - General  CHIEF COMPLAINTS/PURPOSE OF CONSULTATION:  Neuroendocrine tumor of the cecum  ASSESSMENT & PLAN:  No problem-specific Assessment & Plan notes found for this encounter.  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 Pt is completely asymptomatic. He had laparoscopic right hemicolectomy which showed well differentiated NET 2.5 cms, involvement of tumor in 6/16 LN, ki 67 3%, p T3N1. Please review below mentioned pathology. Given mesenteric mass identified on the surgery, I think its reasonable to monitor with abdominal imaging about every 6 months. We discussed about symptoms to watch as well. He has no current symptoms. PE today healthy male patient, no concerns. Given complete surgical resection, we recommend H and P, every 3 months, CT imaging approximately twice a yr or based on symptoms and physician's judgement, and labs. Advised to avoid eating fruits, eggplant, plantain and PPI before the labs for at least 48 hrs. RTC in early March after repeat imaging and labs. No FH of pancreatic, thyroid or pituitary tumors suggestive fo MEN syndrome. Colonoscopy yearly suggested by GI, please FU with all your other doctors.  Orders Placed This Encounter  Procedures  . CT Abdomen Pelvis W Wo Contrast    This exam should ONLY be ordered for initial diagnosis or follow up of known pancreatic/liver/renal/bladder masses.    Standing Status:   Future    Standing Expiration Date:   11/25/2021    Order Specific Question:   If indicated for the ordered procedure, I authorize the administration of contrast media per Radiology protocol    Answer:   Yes    Order Specific Question:   Preferred imaging location?    Answer:   [facility]    Order Specific Question:   Is Oral Contrast requested for this exam?    Answer:   Yes, Per Radiology protocol     HISTORY OF PRESENTING ILLNESS:  Christian Perez 49 y.o. male is here because of newly diagnosed neuroendocrine tumor of the cecum.  Mr Christian Perez is a 49 yr old who had screening colonoscopy which showed a 3.1 cm cecal mass. 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 He had staging CT abdomen and pelvis which showed a 2.5 cm mass in the cecum abutting the soft tissue medially. CT chest without any disease according to OSH records. He had laparoscopic right hemicolectomy which showed well differentiated NET 2.5 cms, involvement of tumor in 6/16 LN, ki 67 3%, p T3N1.  He is here for FU and surveillance. He is doing well, no concerns, he has some diarrhea since surgery, he describes this as more frequent bowel movements, like he has a urge to poop everytime he eats, stool is well formed otherwise. Rest of the ROS as mentioned below and negative  REVIEW OF SYSTEMS:   Constitutional: Denies fevers, chills or abnormal night sweats Eyes: Denies blurriness of vision, double vision or watery eyes Ears, nose, mouth, throat, and face: Denies mucositis or sore throat Respiratory: Denies cough, dyspnea or wheezes Cardiovascular: Denies palpitation, chest discomfort or lower extremity swelling Gastrointestinal:  Denies nausea, heartburn or change in bowel habits Skin: Denies abnormal skin rashes Lymphatics: Denies new lymphadenopathy or easy bruising Neurological:Denies numbness, tingling or new weaknesses Behavioral/Psych: Mood is stable, no new changes  All other systems  were reviewed with the patient and are negative.  MEDICAL HISTORY:  Past Medical History:  Diagnosis Date  . COVID-19 09/15/2020  . Hypertension   . Sleep apnea    cpap- seting at 12     SURGICAL HISTORY: Past Surgical History:  Procedure Laterality Date  . LAPAROSCOPIC RIGHT HEMI COLECTOMY Right  10/23/2020   Procedure: LAPAROSCOPIC RIGHT HEMI COLECTOMY WITH TAP BLOCK;  Surgeon: Ileana Roup, MD;  Location: [facility];  Service: General;  Laterality: Right;  . right wrist surgery       SOCIAL HISTORY: Social History   Socioeconomic History  . Marital status: Married    Spouse name: Not on file  . Number of children: 2  . Years of education: 55  . Highest education level: Not on file  Occupational History  . Not on file  Tobacco Use  . Smoking status: Current Some Day Smoker    Types: Cigars  . Smokeless tobacco: Never Used  . Tobacco comment: cigar once per week   Vaping Use  . Vaping Use: Never used  Substance and Sexual Activity  . Alcohol use: Yes    Comment: once per week   . Drug use: Never  . Sexual activity: Not on file  Other Topics Concern  . Not on file  Social History Narrative  . Not on file   Social Determinants of Health   Financial Resource Strain: Low Risk   . Difficulty of Paying Living Expenses: Not hard at all  Food Insecurity: No Food Insecurity  . Worried About Charity fundraiser in the Last Year: Never true  . Ran Out of Food in the Last Year: Never true  Transportation Needs: No Transportation Needs  . Lack of Transportation (Medical): No  . Lack of Transportation (Non-Medical): No  Physical Activity: Inactive  . Days of Exercise per Week: 0 days  . Minutes of Exercise per Session: 0 min  Stress: No Stress Concern Present  . Feeling of Stress : Not at all  Social Connections: Unknown  . Frequency of Communication with Friends and Family: More than three times a week  . Frequency of Social Gatherings with Friends and Family: Never  . Attends Religious Services: Not on file  . Active Member of Clubs or Organizations: No  . Attends Archivist Meetings: Never  . Marital Status: Never married  Intimate Partner Violence: Not At Risk  . Fear of Current or Ex-Partner: No  . Emotionally Abused: No  . Physically Abused: No   . Sexually Abused: No    FAMILY HISTORY: No family history on file.  ALLERGIES:  has No Known Allergies.  MEDICATIONS:  Current Outpatient Medications  Medication Sig Dispense Refill  . amLODipine-olmesartan (AZOR) 10-40 MG tablet Take 1 tablet by mouth daily.    Marland Kitchen BYSTOLIC 5 MG tablet Take 5 mg by mouth daily.    . ergocalciferol (VITAMIN D2) 1.25 MG (50000 UT) capsule Take 50,000 Units by mouth once a week.    . hydrochlorothiazide (HYDRODIURIL) 25 MG tablet Take 25 mg by mouth daily.    . pravastatin (PRAVACHOL) 40 MG tablet Take 40 mg by mouth daily.     No current facility-administered medications for this visit.     PHYSICAL EXAMINATION: ECOG PERFORMANCE STATUS: 0 - Asymptomatic  Vitals:   11/25/20 0815  BP: (!) 146/86  Pulse: (!) 50  Resp: 18  Temp: (!) 97 F (36.1 C)  SpO2: 99%   Filed Weights  11/25/20 0815  Weight: 285 lb 4.4 oz (129.4 kg)    GENERAL:alert, no distress and comfortable SKIN: skin color, texture, turgor are normal, no rashes or significant lesions EYES: normal, conjunctiva are pink and non-injected, sclera clear OROPHARYNX:no exudate, no erythema and lips, buccal mucosa, and tongue normal  NECK: supple, thyroid normal size, non-tender, without nodularity LYMPH:  no palpable lymphadenopathy in the cervical, axillary or inguinal LUNGS: clear to auscultation and percussion with normal breathing effort HEART: regular rate & rhythm and no murmurs and no lower extremity edema ABDOMEN:abdomen soft, non-tender and normal bowel sounds. Laparoscopic scars are well healed. Musculoskeletal:no cyanosis of digits and no clubbing  PSYCH: alert & oriented x 3 with fluent speech NEURO: no focal motor/sensory deficits  LABORATORY DATA:  I have reviewed the data as listed Lab Results  Component Value Date   WBC 5.5 10/26/2020   HGB 14.2 10/26/2020   HCT 44.7 10/26/2020   MCV 84.5 10/26/2020   PLT 184 10/26/2020     Chemistry      Component Value  Date/Time   NA 138 10/26/2020 0508   K 3.7 10/26/2020 0508   CL 103 10/26/2020 0508   CO2 29 10/26/2020 0508   BUN 13 10/26/2020 0508   CREATININE 1.29 (H) 10/26/2020 0508      Component Value Date/Time   CALCIUM 8.8 (L) 10/26/2020 0508   ALKPHOS 52 10/16/2020 1406   AST 26 10/16/2020 1406   ALT 33 10/16/2020 1406   BILITOT 1.0 10/16/2020 1406      RADIOGRAPHIC STUDIES: Reviewed imaging reports.  CT chest, no concerns for metastatic disease. I dont have a CT abdomen to review, just got the summary of the findings from OSH reports.  Pathology  FINAL MICROSCOPIC DIAGNOSIS:   A. COLON, RIGHT, RESECTION:  - Well-differentiated neuroendocrine tumor, 2.5 cm.  - Tumor focally extends into peri-ileal connective tissue.  - Metastatic neuroendocrine tumor in six of sixteen lymph nodes (6/16).  - Extramural tumor nodule, 2 cm.  - Margins not involved.  - See oncology table.   ONCOLOGY TABLE:  ILEUM NEUROENDOCRINE TUMOR  Procedure: Right hemicolectomy.  Tumor Site: Distal terminal ileum at ileocecal valve.  Tumor Size: 2.5 x 2 x 1 cm.  Tumor Focality: Unifocal.  Histologic Type and Grade: Neuroendocrine tumor, grade 1.    Mitotic Rate: Less than 2 per 10 high-power fields.    Ki-67 Labeling Index: Less than 3%.  Tumor Extension: Into peri-ileal connective tissue.  Margins: Not involved by tumor.  Lymphovascular Invasion: Present.  Large Mesenteric Masses (>2 cm): One mesenteric mass, 2 cm.  Regional Lymph Nodes:    Number of Lymph Nodes Involved: 6    Number of Lymph Nodes Examined: 16  Pathologic Stage Classification (pTNM, AJCC 8th Edition): pT3, pN1  Representative Tumor Block: A3, A4, A5 and A6  Comment(s): Immunohistochemistry for Ki-67 shows a proliferation rate of  less than 3%.  (v1.0.0.2)   Time I spent total of 45 minutes in the care of this patient including H and P, review of records, counseling and coordination of care.    Benay Pike,  MD 11/25/2020 8:47 AM

## 2020-11-25 ENCOUNTER — Inpatient Hospital Stay (HOSPITAL_COMMUNITY)
Payer: Federal, State, Local not specified - PPO | Attending: Hematology and Oncology | Admitting: Hematology and Oncology

## 2020-11-25 ENCOUNTER — Encounter (HOSPITAL_COMMUNITY): Payer: Self-pay | Admitting: Hematology and Oncology

## 2020-11-25 ENCOUNTER — Other Ambulatory Visit: Payer: Self-pay

## 2020-11-25 VITALS — BP 146/86 | HR 50 | Temp 97.0°F | Resp 18 | Ht 78.0 in | Wt 285.3 lb

## 2020-11-25 DIAGNOSIS — F17210 Nicotine dependence, cigarettes, uncomplicated: Secondary | ICD-10-CM | POA: Diagnosis not present

## 2020-11-25 DIAGNOSIS — E785 Hyperlipidemia, unspecified: Secondary | ICD-10-CM | POA: Insufficient documentation

## 2020-11-25 DIAGNOSIS — Z79899 Other long term (current) drug therapy: Secondary | ICD-10-CM | POA: Insufficient documentation

## 2020-11-25 DIAGNOSIS — C7A8 Other malignant neuroendocrine tumors: Secondary | ICD-10-CM

## 2020-11-25 DIAGNOSIS — G4730 Sleep apnea, unspecified: Secondary | ICD-10-CM | POA: Diagnosis not present

## 2020-11-25 DIAGNOSIS — I10 Essential (primary) hypertension: Secondary | ICD-10-CM | POA: Insufficient documentation

## 2020-11-25 NOTE — Patient Instructions (Signed)
[place] at [facility] Discharge Instructions   You were seen today by Dr. Chryl Heck.  She doesn't want to do anything today.  We will do a repeat scan in 3 months with labs and then follow up here.    Thank you for choosing [place] at [facility] to provide your oncology and hematology care.  To afford each patient quality time with our provider, please arrive at least 15 minutes before your scheduled appointment time.   If you have a lab appointment with the [place] please come in thru the Main Entrance and check in at the main information desk.  You need to re-schedule your appointment should you arrive 10 or more minutes late.  We strive to give you quality time with our providers, and arriving late affects you and other patients whose appointments are after yours.  Also, if you no show three or more times for appointments you may be dismissed from the clinic at the providers discretion.     Again, thank you for choosing [facility].  Our hope is that these requests will decrease the amount of time that you wait before being seen by our physicians.       _____________________________________________________________  Should you have questions after your visit to [facility], please contact our office at [phone] and follow the prompts.  Our office hours are 8:00 a.m. and 4:30 p.m. Monday - Friday.  Please note that voicemails left after 4:00 p.m. may not be returned until the following business day.  We are closed weekends and major holidays.  You do have access to a nurse 24-7, just call the main number to the clinic [phone] and do not press any options, hold on the line and a nurse will answer the phone.    For prescription refill requests, have your pharmacy contact our office and allow 72 hours.    Due to Covid, you will need to wear a mask upon entering the hospital. If you do not have a mask, a mask  will be given to you at the Main Entrance upon arrival. For doctor visits, patients may have 1 support person age 5 or older with them. For treatment visits, patients can not have anyone with them due to social distancing guidelines and our immunocompromised population.

## 2021-02-23 ENCOUNTER — Ambulatory Visit (HOSPITAL_COMMUNITY)
Admission: RE | Admit: 2021-02-23 | Discharge: 2021-02-23 | Disposition: A | Discharge status: Home / Self Care | Payer: Federal, State, Local not specified - PPO | Source: Ambulatory Visit | Attending: Hematology and Oncology | Admitting: Hematology and Oncology

## 2021-02-23 ENCOUNTER — Inpatient Hospital Stay (HOSPITAL_COMMUNITY): Payer: Federal, State, Local not specified - PPO | Attending: Hematology

## 2021-02-23 ENCOUNTER — Other Ambulatory Visit: Payer: Self-pay

## 2021-02-23 DIAGNOSIS — C7A8 Other malignant neuroendocrine tumors: Secondary | ICD-10-CM | POA: Diagnosis not present

## 2021-02-23 DIAGNOSIS — C7A1 Malignant poorly differentiated neuroendocrine tumors: Secondary | ICD-10-CM | POA: Diagnosis present

## 2021-02-23 LAB — CBC WITH DIFFERENTIAL/PLATELET
Abs Immature Granulocytes: 0.01 10*3/uL (ref 0.00–0.07)
Basophils Absolute: 0 10*3/uL (ref 0.0–0.1)
Basophils Relative: 1 %
Eosinophils Absolute: 0.1 10*3/uL (ref 0.0–0.5)
Eosinophils Relative: 2 %
HCT: 47.4 % (ref 39.0–52.0)
Hemoglobin: 14.9 g/dL (ref 13.0–17.0)
Immature Granulocytes: 0 %
Lymphocytes Relative: 37 %
Lymphs Abs: 1.7 10*3/uL (ref 0.7–4.0)
MCH: 27.3 pg (ref 26.0–34.0)
MCHC: 31.4 g/dL (ref 30.0–36.0)
MCV: 87 fL (ref 80.0–100.0)
Monocytes Absolute: 0.5 10*3/uL (ref 0.1–1.0)
Monocytes Relative: 10 %
Neutro Abs: 2.4 10*3/uL (ref 1.7–7.7)
Neutrophils Relative %: 50 %
Platelets: 168 10*3/uL (ref 150–400)
RBC: 5.45 MIL/uL (ref 4.22–5.81)
RDW: 14.2 % (ref 11.5–15.5)
WBC: 4.7 10*3/uL (ref 4.0–10.5)
nRBC: 0 % (ref 0.0–0.2)

## 2021-02-23 LAB — COMPREHENSIVE METABOLIC PANEL
ALT: 28 U/L (ref 0–44)
AST: 20 U/L (ref 15–41)
Albumin: 4.2 g/dL (ref 3.5–5.0)
Alkaline Phosphatase: 75 U/L (ref 38–126)
Anion gap: 8 (ref 5–15)
BUN: 16 mg/dL (ref 6–20)
CO2: 28 mmol/L (ref 22–32)
Calcium: 8.8 mg/dL — ABNORMAL LOW (ref 8.9–10.3)
Chloride: 106 mmol/L (ref 98–111)
Creatinine, Ser: 1.29 mg/dL — ABNORMAL HIGH (ref 0.61–1.24)
GFR, Estimated: >60 mL/min (ref >60)
Glucose, Bld: 114 mg/dL — ABNORMAL HIGH (ref 70–99)
Potassium: 4.1 mmol/L (ref 3.5–5.1)
Sodium: 142 mmol/L (ref 135–145)
Total Bilirubin: 0.7 mg/dL (ref 0.3–1.2)
Total Protein: 7.2 g/dL (ref 6.5–8.1)

## 2021-02-23 MED ORDER — IOHEXOL 300 MG/ML  SOLN
100.0000 mL | Freq: Once | INTRAMUSCULAR | Status: AC | PRN
  Administered 2021-02-23: 100 mL via INTRAVENOUS

## 2021-02-25 LAB — CHROMOGRANIN A: Chromogranin A (ng/mL): 63.5 ng/mL (ref 0.0–101.8)

## 2021-03-02 ENCOUNTER — Ambulatory Visit (HOSPITAL_COMMUNITY): Payer: Federal, State, Local not specified - PPO | Admitting: Hematology

## 2022-04-23 IMAGING — CT CT CHEST W/ CM
1 series · 16 of 34 positions shown, 20 images · IV contrast (APPLIED)
Comparison: None.

CLINICAL DATA: Recent diagnosis colon cancer, evaluate for
metastatic disease

EXAM:
CT CHEST WITH CONTRAST
TECHNIQUE: Multidetector CT imaging of the chest was performed during
intravenous contrast administration.
CONTRAST:  75mL EDMTMW-8EE IOPAMIDOL (EDMTMW-8EE) INJECTION 61%

[Series 2: chest w/cm · axial · 0.82mm/px · z∈[-313,+11]mm · 16 of 183 slices shown, 20 images]
[im 14/183  mediastinal]
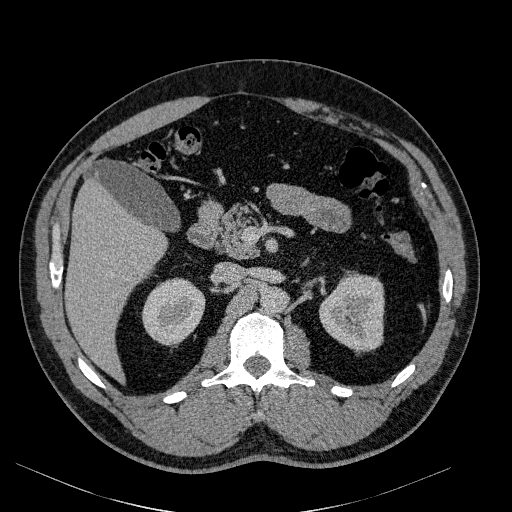
[im 14/183  lung]
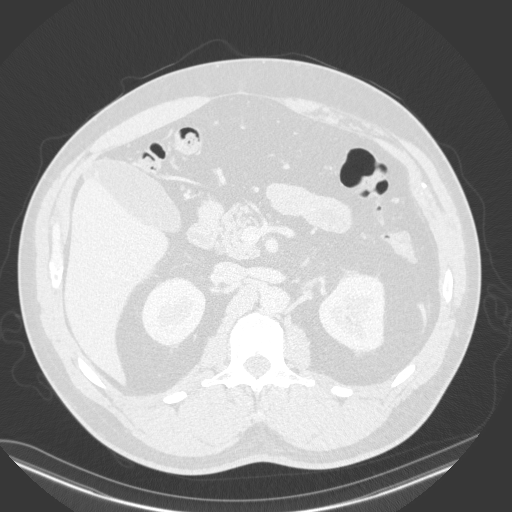
[im 27/183  lung]
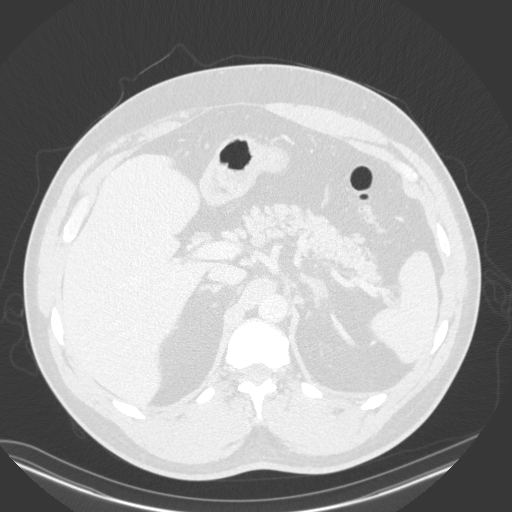
[im 37/183  lung]
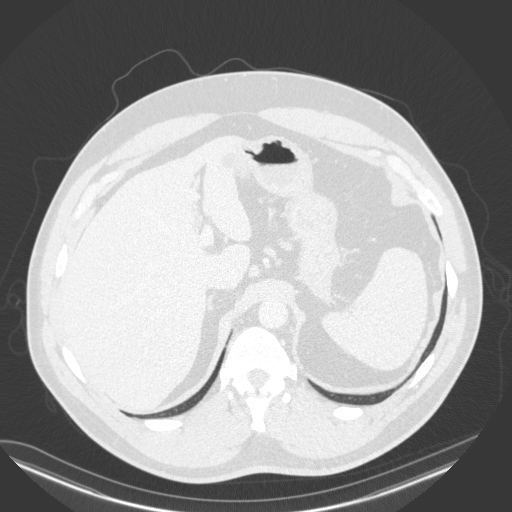
[im 48/183  lung]
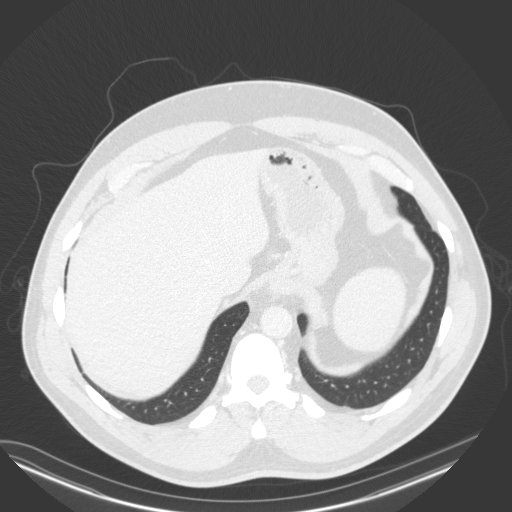
[im 61/183  mediastinal]
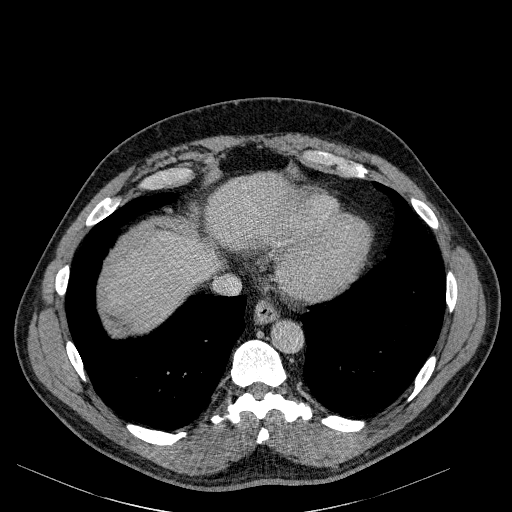
[im 61/183  lung]
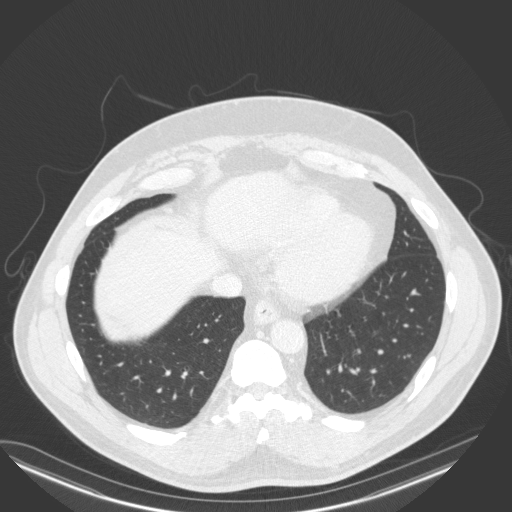
[im 73/183  lung]
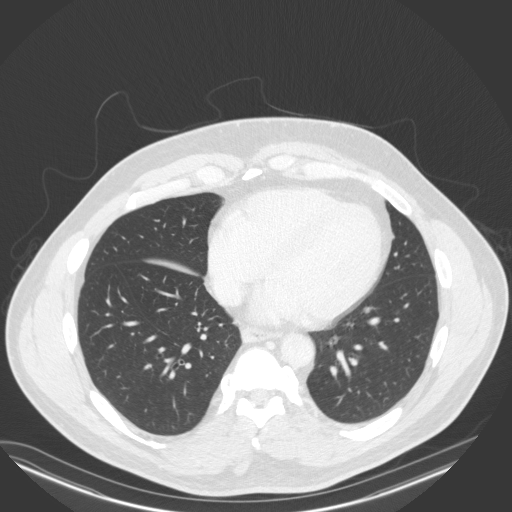
[im 81/183  lung]
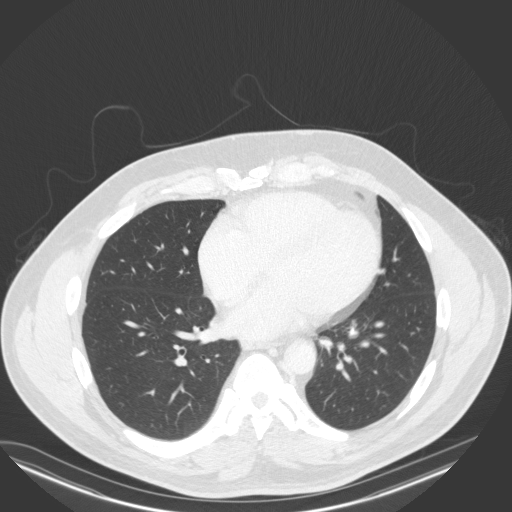
[im 88/183  lung]
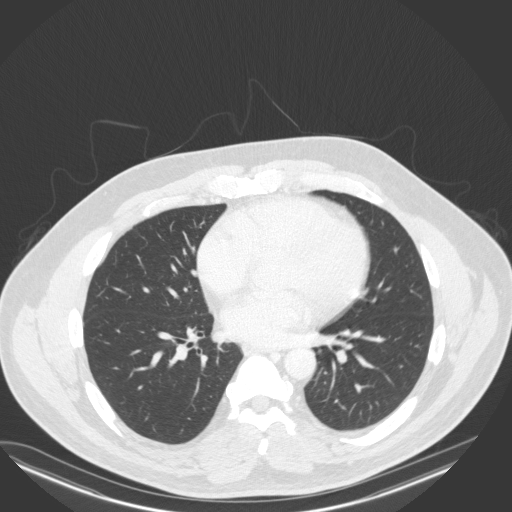
[im 96/183  mediastinal]
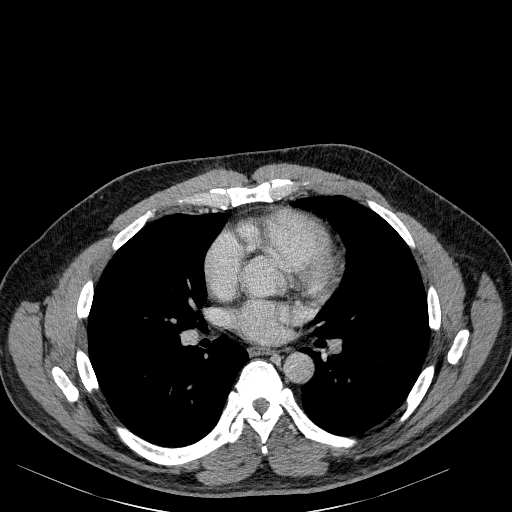
[im 96/183  lung]
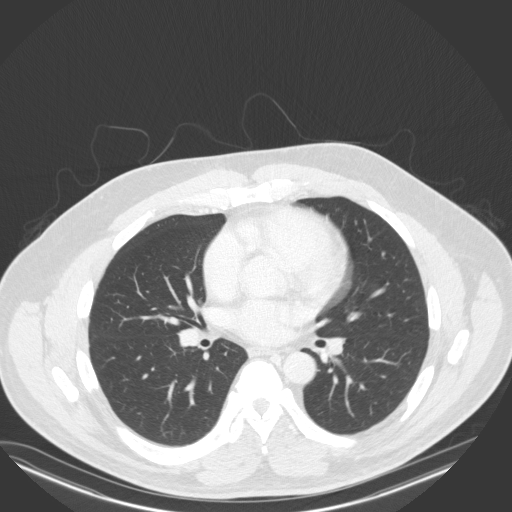
[im 108/183  lung]
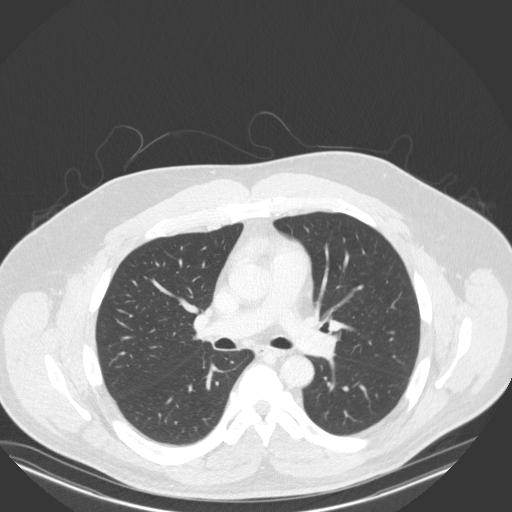
[im 115/183  lung]
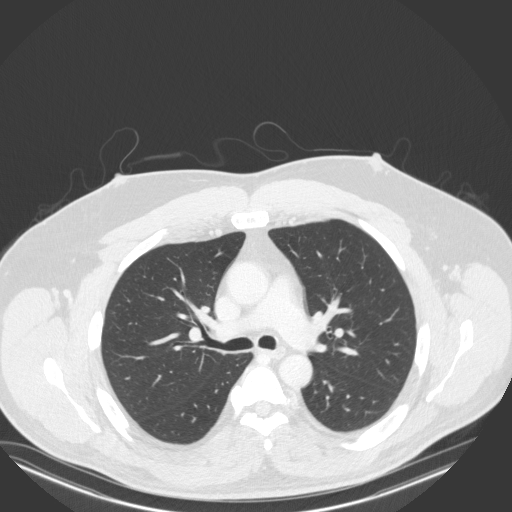
[im 129/183  lung]
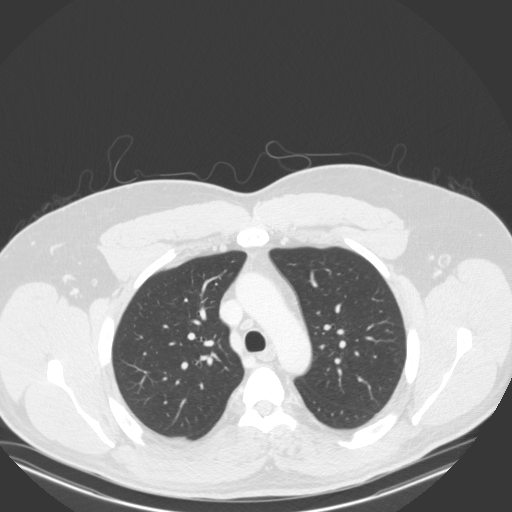
[im 142/183  mediastinal]
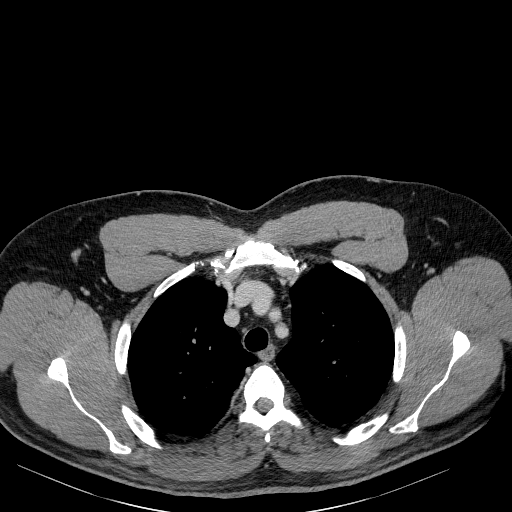
[im 142/183  lung]
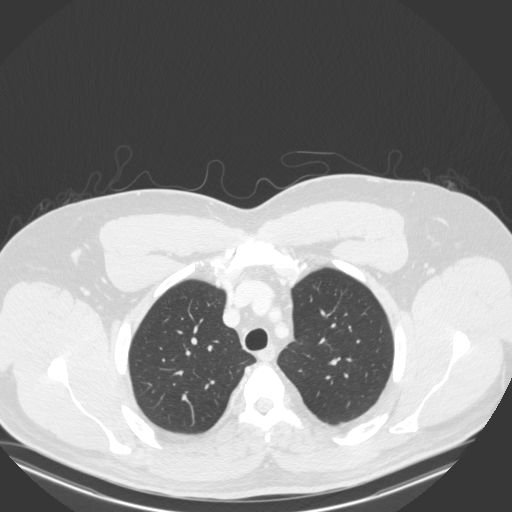
[im 149/183  lung]
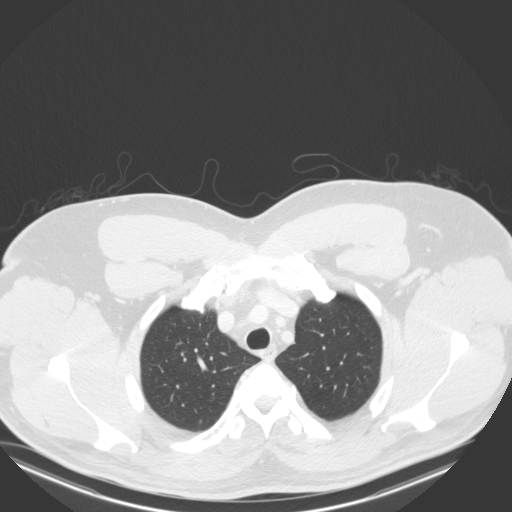
[im 162/183  lung]
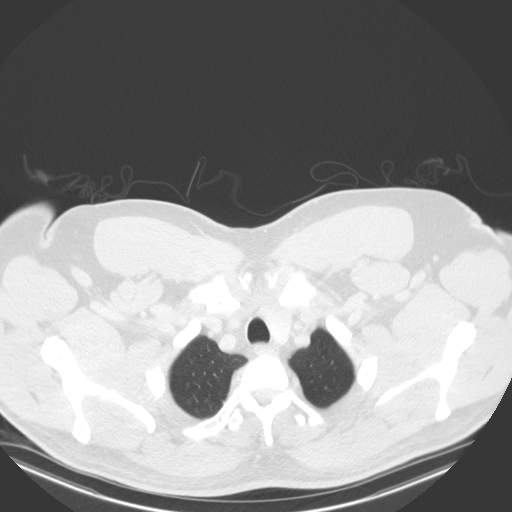
[im 176/183  lung]
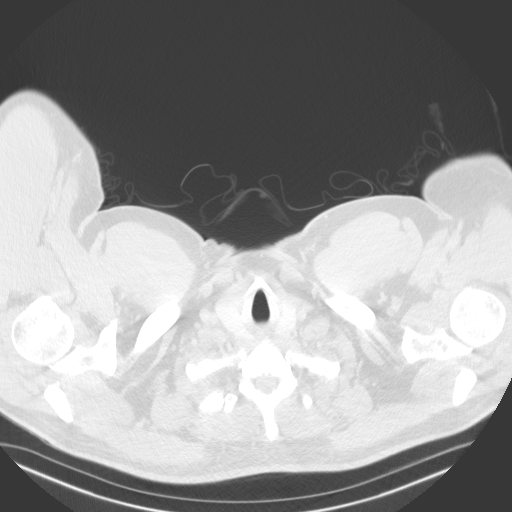

[16 of 34 positions shown; findings below may reference images not displayed]

FINDINGS: Cardiovascular: No significant vascular findings. Normal heart size.
No pericardial effusion.

Mediastinum/Nodes: No enlarged mediastinal, hilar, or axillary lymph
nodes. Thyroid gland, trachea, and esophagus demonstrate no
significant findings.

Lungs/Pleura: Lungs are clear. No pleural effusion or pneumothorax.

Upper Abdomen: No acute abnormality.

Musculoskeletal: No chest wall mass or suspicious bone lesions
identified.
IMPRESSION: No evidence of metastatic disease in the chest.

## 2022-10-12 IMAGING — CT CT ABD-PELV W/ CM
2 of 5 series · 15 of 46 positions shown, 17 images · IV contrast (Omnipaque or Isovue)
Comparison: LC CT abdomen pelvis July 29, 2020.

CLINICAL DATA: Primary malignant neuroendocrine tumor of cecum

EXAM:
CT ABDOMEN AND PELVIS WITH CONTRAST
TECHNIQUE: Multidetector CT imaging of the abdomen and pelvis was performed
using the standard protocol following bolus administration of
intravenous contrast.
CONTRAST:  100mL OMNIPAQUE IOHEXOL 300 MG/ML  SOLN

[Series 2: axial st · axial · 0.85mm/px · z∈[+964,+1444]mm · 12 of 108 slices shown, 14 images]
[im 6/108  soft-tissue]
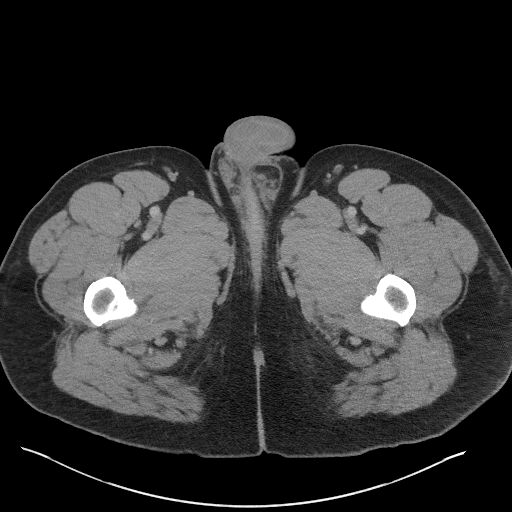
[im 6/108  bone]
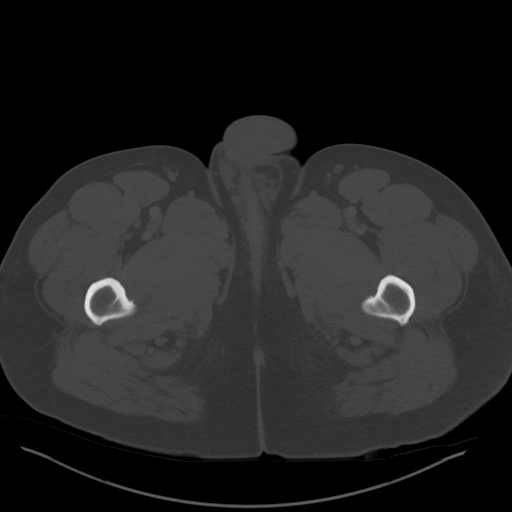
[im 17/108  soft-tissue]
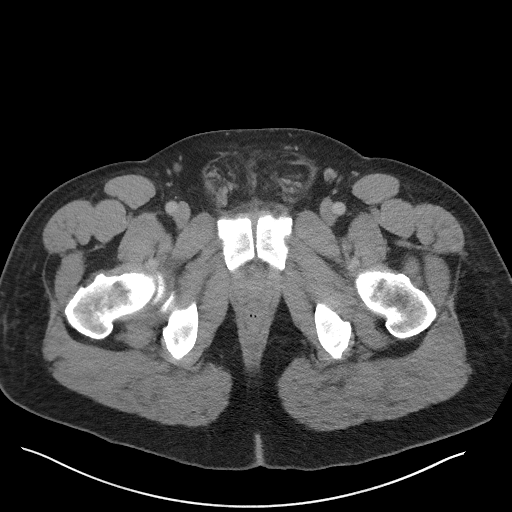
[im 23/108  soft-tissue]
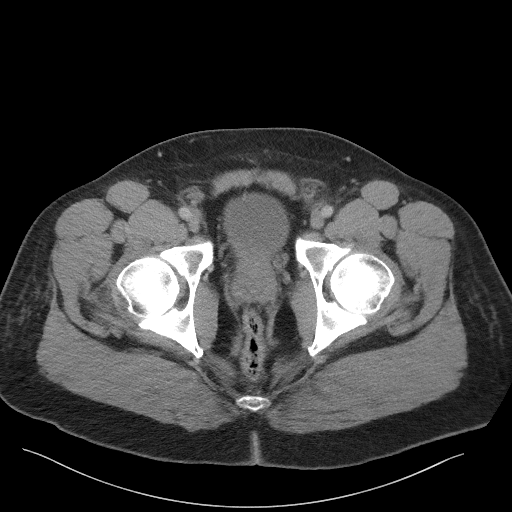
[im 34/108  soft-tissue]
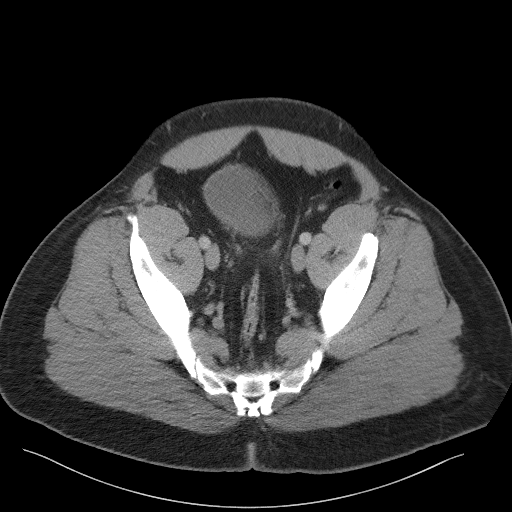
[im 40/108  soft-tissue]
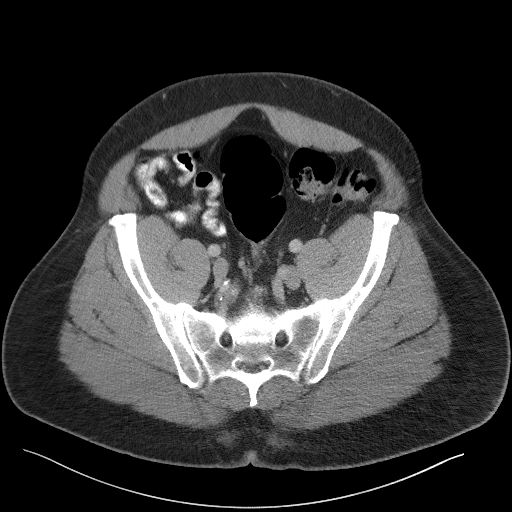
[im 51/108  soft-tissue]
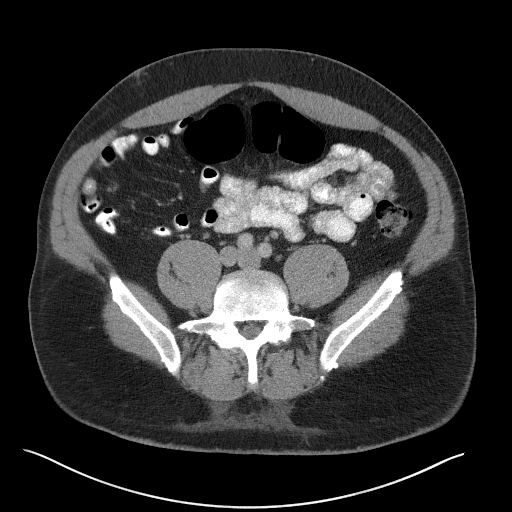
[im 57/108  soft-tissue]
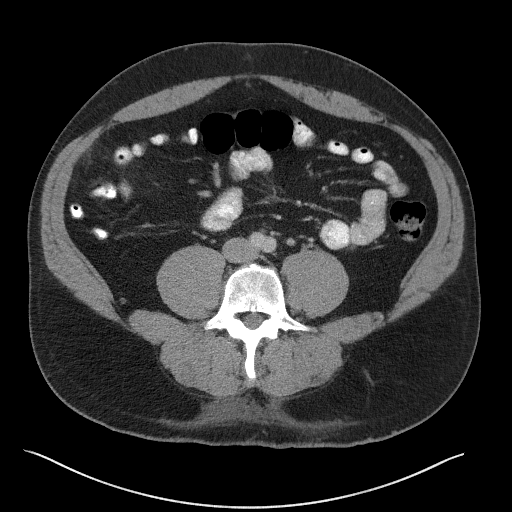
[im 68/108  soft-tissue]
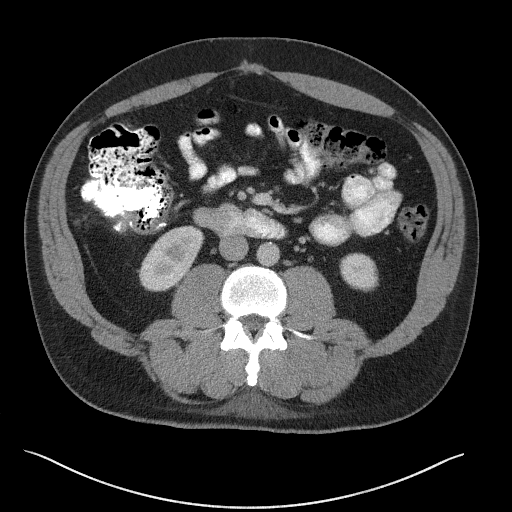
[im 74/108  soft-tissue]
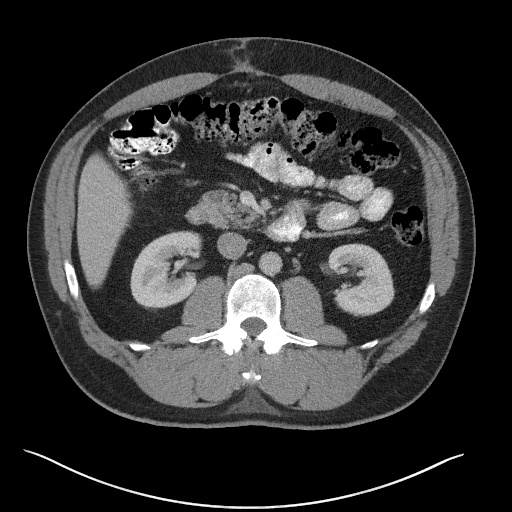
[im 74/108  bone]
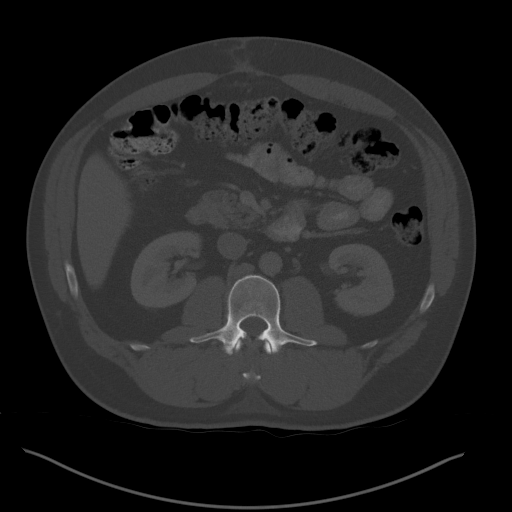
[im 85/108  soft-tissue]
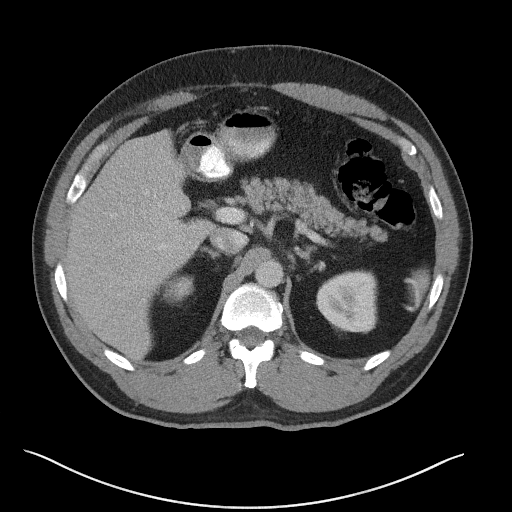
[im 91/108  soft-tissue]
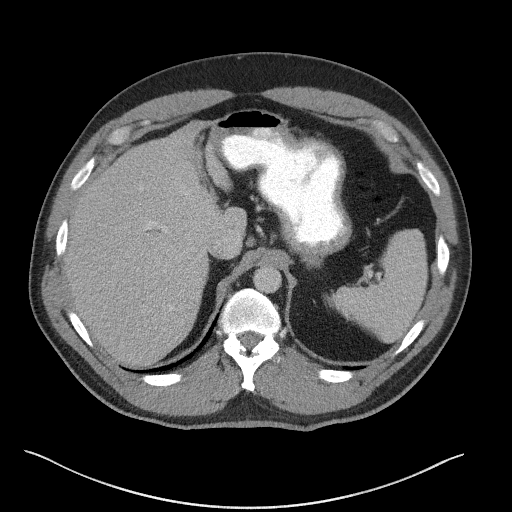
[im 102/108  soft-tissue]
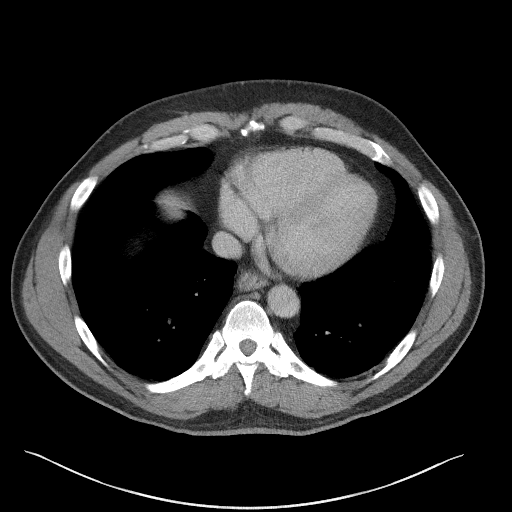

[Series 5: coronal st · coronal · 0.90mm/px · 3 of 124 slices shown]
[im 42/124  soft-tissue]
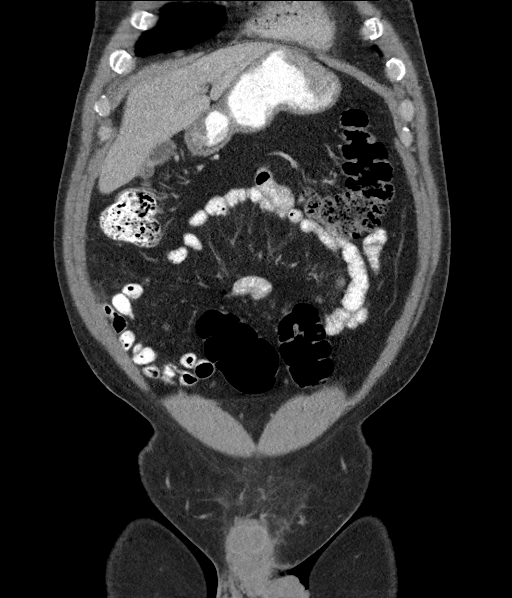
[im 55/124  soft-tissue]
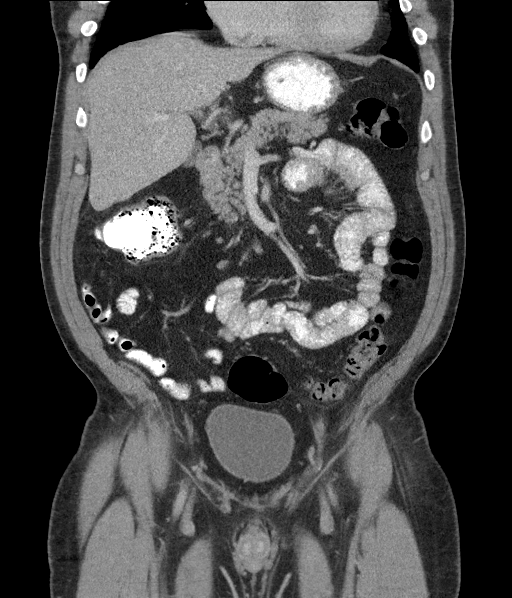
[im 69/124  soft-tissue]
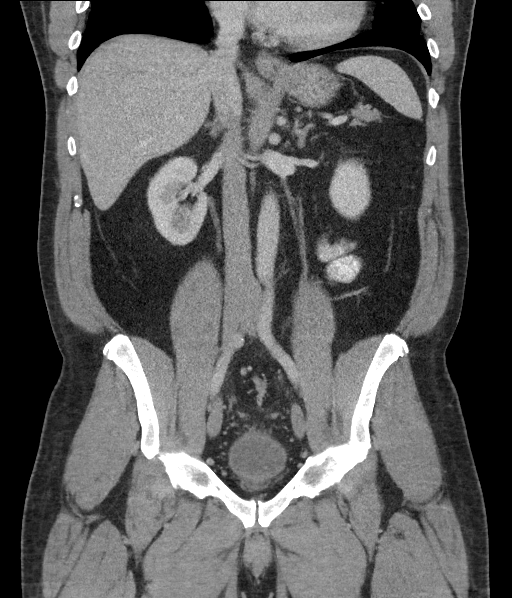

[15 of 46 positions shown; findings below may reference images not displayed]

FINDINGS: Lower chest: No acute abnormality. Normal size heart. No pericardial
effusion/thickening. Small hiatal hernia.

Hepatobiliary: There bilobar hypodense hepatic lesions instance a
1.4 cm segment III lesion [DATE] and 1.0 cm hypodense segment II
lesion on image [DATE], these are unchanged in size in comparison to
prior imaging which was of multiphase study image 16 demonstrate
suspicious arterial, favored represent hepatic cysts.

Pancreas: Unremarkable.

Spleen: Unremarkable.

Adrenals/Urinary Tract: Bilateral adrenal glands are unremarkable

Hydronephrosis. No suspicious renal lesions. No filling defect
visualized within the opacified portions the collecting system and
ureters delayed imaging. Urinary bladder is grossly unremarkable for
degree of distension.

Stomach/Bowel: Is unremarkable. Normal positioning duodenum/ligament
of [place]. No suspicious small bowel wall thickening or dilation.
Changes right hemicolectomy with anastomosis in right upper
quadrant. There is mild associated stranding the anastomosis without
discrete soft tissue nodule favor postoperative change.

Vascular/Lymphatic: No significant vascular findings are present.

Reproductive: There are prominent mesenteric and retroperitoneal
lymph nodes without pathologically lymph nodes in abdomen or pelvis.

Other: No abdominopelvic ascites. Multiple 5 mm or smaller soft
tissue foci within the mesentery for instance on image 47/5, 52/5
and 60/5 which are favored to represent prominent mesenteric lymph
nodes. No definite suspicious soft tissue nodularity in the
mesentery or omentum. Postsurgical change in the anterior abdominal
wall.

Musculoskeletal: L5-S1 discogenic disease. Degenerative change of
the hips. No acute osseous abnormality.
IMPRESSION: 1. Changes of right hemicolectomy and anastomosis, with mild fat
stranding in the surgical bed. Without discrete soft tissue nodule,
favored to represent postoperative change. Attention on follow-up
imaging is recommended. No definite findings to suggest
recurrence/residual or metastatic disease in the abdomen or pelvis.
2. Multiple 5 mm or smaller soft tissue foci within the mesentery
are favored to represent prominent mesenteric lymph nodes. No
definite suspicious soft tissue nodularity in the mesentery or
omentum to suggest peritoneal implants. Attention on follow-up
imaging is recommended.
3. Bilobed hypodense hepatic lesions, unchanged in size in
comparison to prior multiphase imaging in which no arterial
enhancement with demonstrated, most consistent with hepatic cysts.
